# Patient Record
Sex: Male | Born: 1960 | Race: Black or African American | Hispanic: No | Marital: Single | Smoking: Current every day smoker
Health system: Southern US, Community
[De-identification: ages and names within clinical notes are randomized; demographics above are authoritative.]

## PROBLEM LIST (undated history)

## (undated) DIAGNOSIS — I10 Essential (primary) hypertension: Secondary | ICD-10-CM

## (undated) DIAGNOSIS — B1920 Unspecified viral hepatitis C without hepatic coma: Secondary | ICD-10-CM

---

## 2009-01-29 ENCOUNTER — Ambulatory Visit: Payer: Self-pay | Admitting: Gastroenterology

## 2009-03-02 ENCOUNTER — Ambulatory Visit: Payer: Self-pay | Admitting: Gastroenterology

## 2009-04-09 ENCOUNTER — Ambulatory Visit: Payer: Self-pay | Admitting: Gastroenterology

## 2009-10-08 ENCOUNTER — Ambulatory Visit: Payer: Self-pay | Admitting: Gastroenterology

## 2011-08-03 ENCOUNTER — Encounter (HOSPITAL_COMMUNITY): Payer: Self-pay

## 2011-08-03 ENCOUNTER — Emergency Department (HOSPITAL_COMMUNITY)
Admission: EM | Admit: 2011-08-03 | Discharge: 2011-08-03 | Disposition: A | Discharge status: Home / Self Care | Payer: Medicaid Other | Attending: Emergency Medicine | Admitting: Emergency Medicine

## 2011-08-03 ENCOUNTER — Encounter (HOSPITAL_COMMUNITY): Payer: Self-pay | Admitting: *Deleted

## 2011-08-03 ENCOUNTER — Emergency Department (HOSPITAL_COMMUNITY)
Admission: EM | Admit: 2011-08-03 | Discharge: 2011-08-03 | Disposition: A | Discharge status: Home / Self Care | Payer: Self-pay | Attending: Emergency Medicine | Admitting: Emergency Medicine

## 2011-08-03 DIAGNOSIS — I10 Essential (primary) hypertension: Secondary | ICD-10-CM | POA: Insufficient documentation

## 2011-08-03 DIAGNOSIS — R040 Epistaxis: Secondary | ICD-10-CM

## 2011-08-03 DIAGNOSIS — Z79899 Other long term (current) drug therapy: Secondary | ICD-10-CM | POA: Insufficient documentation

## 2011-08-03 DIAGNOSIS — B182 Chronic viral hepatitis C: Secondary | ICD-10-CM | POA: Insufficient documentation

## 2011-08-03 DIAGNOSIS — M810 Age-related osteoporosis without current pathological fracture: Secondary | ICD-10-CM | POA: Insufficient documentation

## 2011-08-03 DIAGNOSIS — Z8619 Personal history of other infectious and parasitic diseases: Secondary | ICD-10-CM | POA: Insufficient documentation

## 2011-08-03 HISTORY — DX: Essential (primary) hypertension: I10

## 2011-08-03 HISTORY — DX: Unspecified viral hepatitis C without hepatic coma: B19.20

## 2011-08-03 MED ORDER — OXYMETAZOLINE HCL 0.05 % NA SOLN
1.0000 | Freq: Once | NASAL | Status: AC
  Administered 2011-08-03: 1 via NASAL
  Filled 2011-08-03: qty 15

## 2011-08-03 MED ORDER — AYR SALINE NASAL NA GEL: NASAL | Status: DC

## 2011-08-03 NOTE — ED Provider Notes (Signed)
History     CSN: [id number]  Arrival date & time 08/03/11  0920   First MD Initiated Contact with Patient 08/03/11 0935      Chief Complaint  Patient presents with  . Epistaxis    (Consider location/radiation/quality/duration/timing/severity/associated sxs/prior treatment) Patient is a 51 y.o. male presenting with nosebleeds. The history is provided by the patient.  Epistaxis  This is a recurrent problem. The current episode started more than 1 week ago. The problem occurs daily. The bleeding has been from the left nare.  Pt ws just discharged from the ED treated for nose bleed. States as he was walking to the bus stop, bleeding began again.   Past Medical History  Diagnosis Date  . Hepatitis C   . Hypertension   . Osteoporosis     History reviewed. No pertinent past surgical history.  History reviewed. No pertinent family history.  History  Substance Use Topics  . Smoking status: Current Everyday Smoker  . Smokeless tobacco: Not on file  . Alcohol Use: No      Review of Systems  Constitutional: Negative.   HENT: Positive for nosebleeds.   Eyes: Negative.   Respiratory: Negative.   Cardiovascular: Negative.   Gastrointestinal: Negative.   Genitourinary: Negative.   Musculoskeletal: Negative.   Skin: Negative.   Neurological: Negative.   Psychiatric/Behavioral: Negative.     Allergies  Review of patient's allergies indicates no known allergies.  Home Medications   Current Outpatient Rx  Name Route Sig Dispense Refill  . AMLODIPINE BESY-BENAZEPRIL HCL 2.5-10 MG PO CAPS Oral Take 1 capsule by mouth daily.    Jannetta Quint SALINE NASAL NA GEL  Apply to both nares every 2-4 hrs 14 g 0  . HYDROCHLOROTHIAZIDE 12.5 MG PO CAPS Oral Take 12.5 mg by mouth daily.    Marland Kitchen OLANZAPINE 15 MG PO TABS Oral Take 15 mg by mouth at bedtime.      BP 144/98  Pulse 68  Temp(Src) 97.8 F (36.6 C) (Oral)  Resp 18  SpO2 97%  Physical Exam  Nursing note and vitals  reviewed. Constitutional: He is oriented to person, place, and time. He appears well-developed and well-nourished.  HENT:  Head: Normocephalic.  Nose: Epistaxis is observed.  Mouth/Throat: Uvula is midline and oropharynx is clear and moist.       Epistaxis left nare, mild, postnasal bleeding  Neck: Neck supple.  Cardiovascular: Normal rate, regular rhythm and normal heart sounds.   Pulmonary/Chest: Effort normal and breath sounds normal. No respiratory distress.  Neurological: He is alert and oriented to person, place, and time.  Skin: Skin is warm and dry.  Psychiatric: He has a normal mood and affect.    ED Course  Procedures (including critical care time)  Mild bleeding in left nare noted, pt is picking at it with a tissue. Afrin applied two sprays. Pressure held for 5 min. Bleeding resolved. There is one small spot to the septum with bleeding, which is cautarized using silver nitrate stick. Will watch for few minutes to make sure bleeding does not start again.   10:36 AM Pt monitored. Bleeding resolved. Pt did not want to wait for his papers to be printed. He walked out AMA.    No diagnosis found.    MDM          Lottie Mussel, PA 08/03/11 1036

## 2011-08-03 NOTE — ED Notes (Signed)
Minor bleeding noted. Lemont Fillers, PA at bedside

## 2011-08-03 NOTE — ED Provider Notes (Signed)
Medical screening examination/treatment/procedure(s) were performed by non-physician practitioner and as supervising physician I was immediately available for consultation/collaboration.  Toy Baker, MD 08/03/11 2027

## 2011-08-03 NOTE — Discharge Instructions (Signed)
Your blood pressure here is good, and your bleeding has resolved. Avoid picking your nose or blowing it. Use saline nasal spray for congestion. Use saline gel for moisturizing to prevent new bleeding. If bleeding occurs again, apply firm pressure. You can spray afrin nasal spray to help to stop bleeding, however, do not use this spray more then two times a day and for more then 3 days. Follow up with your doctor for recheck.  Nosebleed Nosebleeds can be caused by many conditions including trauma, infections, polyps, foreign bodies, dry mucous membranes or climate, medications and air conditioning. Most nosebleeds occur in the front of the nose. It is because of this location that most nosebleeds can be controlled by pinching the nostrils gently and continuously. Do this for at least 10 to 20 minutes. The reason for this long continuous pressure is that you must hold it long enough for the blood to clot. If during that 10 to 20 minute time period, pressure is released, the process may have to be started again. The nosebleed may stop by itself, quit with pressure, need concentrated heating (cautery) or stop with pressure from packing. HOME CARE INSTRUCTIONS   If your nose was packed, try to maintain the pack inside until your caregiver removes it. If a gauze pack was used and it starts to fall out, gently replace or cut the end off. Do not cut if a balloon catheter was used to pack the nose. Otherwise, do not remove unless instructed.   Avoid blowing your nose for 12 hours after treatment. This could dislodge the pack or clot and start bleeding again.   If the bleeding starts again, sit up and bending forward, gently pinch the front half of your nose continuously for 20 minutes.   If bleeding was caused by dry mucous membranes, cover the inside of your nose every morning with a petroleum or antibiotic ointment. Use your little fingertip as an applicator. Do this as needed during dry weather. This will keep  the mucous membranes moist and allow them to heal.   Maintain humidity in your home by using less air conditioning or using a humidifier.   Do not use aspirin or medications which make bleeding more likely. Your caregiver can give you recommendations on this.   Resume normal activities as able but try to avoid straining, lifting or bending at the waist for several days.   If the nosebleeds become recurrent and the cause is unknown, your caregiver may suggest laboratory tests.  SEEK IMMEDIATE MEDICAL CARE IF:   Bleeding recurs and cannot be controlled.   There is unusual bleeding from or bruising on other parts of the body.   You have a fever.   Nosebleeds continue.   There is any worsening of the condition which originally brought you in.   You become lightheaded, feel faint, become sweaty or vomit blood.  MAKE SURE YOU:   Understand these instructions.   Will watch your condition.   Will get help right away if you are not doing well or get worse.  Document Released: 03/16/2005 Document Revised: 02/16/2011 Document Reviewed: 05/08/2009 [facility] Patient Information 2012 [place], [place].

## 2011-08-03 NOTE — ED Provider Notes (Signed)
Medical screening examination/treatment/procedure(s) were performed by non-physician practitioner and as supervising physician I was immediately available for consultation/collaboration.  Toy Baker, MD 08/03/11 2026

## 2011-08-03 NOTE — ED Provider Notes (Signed)
History     CSN: [id number]  Arrival date & time 08/03/11  4098   First MD Initiated Contact with Patient 08/03/11 [phone]      Chief Complaint  Patient presents with  . Epistaxis    (Consider location/radiation/quality/duration/timing/severity/associated sxs/prior treatment) Patient is a 51 y.o. male presenting with nosebleeds. The history is provided by the patient.  Epistaxis  This is a new problem. The current episode started more than 1 week ago. The problem occurs daily. The problem has been resolved. Associated with: blowing his nose. The bleeding has been from both nares. He has tried applying pressure for the symptoms. The treatment provided significant relief.  Pt states he has had nose bleed every day when in the shower. States he blows his nose in the shower and it starts bleeding. Always resolves on its own with pressure. Pt is not on blood thinners. Admits to nose picking "once in a while." No injury to the nose. No headache. No other bleeding anywhere in the body.  Past Medical History  Diagnosis Date  . Hepatitis C   . Hypertension   . Osteoporosis     No past surgical history on file.  No family history on file.  History  Substance Use Topics  . Smoking status: Not on file  . Smokeless tobacco: Not on file  . Alcohol Use:       Review of Systems  Constitutional: Negative for fever, chills and fatigue.  HENT: Positive for nosebleeds.   Eyes: Negative.   Respiratory: Negative.   Cardiovascular: Negative.   Gastrointestinal: Negative.   Genitourinary: Negative.   Musculoskeletal: Negative.   Skin: Negative.   Neurological: Negative.   Hematological: Negative.   Psychiatric/Behavioral: Negative.     Allergies  Review of patient's allergies indicates no known allergies.  Home Medications   Current Outpatient Rx  Name Route Sig Dispense Refill  . AMLODIPINE BESY-BENAZEPRIL HCL 2.5-10 MG PO CAPS Oral Take 1 capsule by mouth daily.    Marland Kitchen  HYDROCHLOROTHIAZIDE 25 MG PO TABS Oral Take 12.5 mg by mouth daily.    Marland Kitchen OLANZAPINE 15 MG PO TABS Oral Take 15 mg by mouth at bedtime.      BP 122/83  Pulse 69  Temp(Src) 97.5 F (36.4 C) (Oral)  Resp 18  SpO2 98%  Physical Exam  Nursing note and vitals reviewed. Constitutional: He is oriented to person, place, and time. He appears well-developed and well-nourished. No distress.  HENT:  Head: Normocephalic and atraumatic.  Right Ear: Tympanic membrane, external ear and ear canal normal.  Left Ear: Tympanic membrane and ear canal normal.  Mouth/Throat: Uvula is midline, oropharynx is clear and moist and mucous membranes are normal.       Dried up blood in left naire. Right naire is normal. No bruising or swelling, or signs of trauma to the nose. No tenderness with palpation  Eyes: Conjunctivae are normal.  Neck: Neck supple.  Cardiovascular: Normal rate, regular rhythm and normal heart sounds.   Pulmonary/Chest: Effort normal and breath sounds normal. He has no wheezes. He has no rales.  Abdominal: Soft. Bowel sounds are normal.  Neurological: He is alert and oriented to person, place, and time.  Skin: Skin is warm and dry.  Psychiatric: He has a normal mood and affect.    ED Course  Procedures (including critical care time)  Pt with no current nose bleed. Instructed pt to stop picking his nose, and stop blowing his nose. Nasal saline and vasiline for  dryness. Afrin if starts bleeding again and pressure. BP here normal. No signs of trauma. No headache. No other complaints. Pt stable for discharge.   No diagnosis found.    MDM          Lottie Mussel, PA 08/03/11 [phone]

## 2011-08-03 NOTE — ED Notes (Signed)
From [facility] - pt reports epistaxis from both nares. Bleeding controlled upon EMS arrival

## 2011-08-03 NOTE — ED Notes (Signed)
Patient presents with epistaxis to left nare that began this AM.  Patient recently discharged from [facility] for same and reports when he walked to the bus stop the bleeding began again. Patient was discharged with Afrin nasal spray.  No active bleeding noted at this time.

## 2012-05-12 ENCOUNTER — Emergency Department (HOSPITAL_COMMUNITY)
Admission: EM | Admit: 2012-05-12 | Discharge: 2012-05-12 | Disposition: A | Discharge status: Home / Self Care | Payer: Medicaid Other

## 2012-05-12 NOTE — ED Notes (Signed)
Per EMS - [facility] lives at residential center, reported to have "stabbed" himself in his scrotum with an ink pen to get rid of "2 bumps that were down there". BP - 144/90, HR - 64, Resp- 16. Pt is ambulatory in ED.

## 2012-05-12 NOTE — ED Notes (Signed)
Pt came out to desk and indicated he did not need to be seen because "the bleeding has stopped". Tried to encourage pt to stay but pt continued and walked out of the ED to the bus stop. Charge nurse notified of same.

## 2012-12-01 ENCOUNTER — Emergency Department (HOSPITAL_COMMUNITY)
Admission: EM | Admit: 2012-12-01 | Discharge: 2012-12-01 | Disposition: A | Discharge status: Home / Self Care | Payer: Medicaid Other | Attending: Emergency Medicine | Admitting: Emergency Medicine

## 2012-12-01 ENCOUNTER — Encounter (HOSPITAL_COMMUNITY): Payer: Self-pay | Admitting: *Deleted

## 2012-12-01 ENCOUNTER — Emergency Department (HOSPITAL_COMMUNITY): Payer: Medicaid Other

## 2012-12-01 DIAGNOSIS — Z8739 Personal history of other diseases of the musculoskeletal system and connective tissue: Secondary | ICD-10-CM | POA: Insufficient documentation

## 2012-12-01 DIAGNOSIS — F17200 Nicotine dependence, unspecified, uncomplicated: Secondary | ICD-10-CM | POA: Insufficient documentation

## 2012-12-01 DIAGNOSIS — Z8619 Personal history of other infectious and parasitic diseases: Secondary | ICD-10-CM | POA: Insufficient documentation

## 2012-12-01 DIAGNOSIS — R079 Chest pain, unspecified: Secondary | ICD-10-CM

## 2012-12-01 DIAGNOSIS — R1110 Vomiting, unspecified: Secondary | ICD-10-CM | POA: Insufficient documentation

## 2012-12-01 DIAGNOSIS — I10 Essential (primary) hypertension: Secondary | ICD-10-CM | POA: Insufficient documentation

## 2012-12-01 DIAGNOSIS — Z79899 Other long term (current) drug therapy: Secondary | ICD-10-CM | POA: Insufficient documentation

## 2012-12-01 DIAGNOSIS — R071 Chest pain on breathing: Secondary | ICD-10-CM | POA: Insufficient documentation

## 2012-12-01 LAB — CBC
HCT: 43.3 % (ref 39.0–52.0)
MCV: 83.4 fL (ref 78.0–100.0)
Platelets: 236 10*3/uL (ref 150–400)
RBC: 5.19 MIL/uL (ref 4.22–5.81)
RDW: 13.7 % (ref 11.5–15.5)
WBC: 6 10*3/uL (ref 4.0–10.5)

## 2012-12-01 LAB — BASIC METABOLIC PANEL
BUN: 17 mg/dL (ref 6–23)
CO2: 27 mEq/L (ref 19–32)
Chloride: 99 mEq/L (ref 96–112)
Creatinine, Ser: 1.14 mg/dL (ref 0.50–1.35)
GFR calc Af Amer: 84 mL/min — ABNORMAL LOW (ref >90)
Potassium: 3.5 mEq/L (ref 3.5–5.1)

## 2012-12-01 LAB — POCT I-STAT TROPONIN I
Troponin i, poc: 0.01 ng/mL (ref 0.00–0.08)
Troponin i, poc: 0 ng/mL (ref 0.00–0.08)

## 2012-12-01 MED ORDER — MORPHINE SULFATE 4 MG/ML IJ SOLN
4.0000 mg | Freq: Once | INTRAMUSCULAR | Status: AC
  Administered 2012-12-01: 4 mg via INTRAVENOUS
  Filled 2012-12-01: qty 1

## 2012-12-01 MED ORDER — PANTOPRAZOLE SODIUM 40 MG IV SOLR
40.0000 mg | Freq: Once | INTRAVENOUS | Status: AC
  Administered 2012-12-01: 40 mg via INTRAVENOUS
  Filled 2012-12-01: qty 40

## 2012-12-01 MED ORDER — ASPIRIN 81 MG PO CHEW
324.0000 mg | CHEWABLE_TABLET | Freq: Once | ORAL | Status: AC
  Administered 2012-12-01: 324 mg via ORAL
  Filled 2012-12-01: qty 4

## 2012-12-01 MED ORDER — ONDANSETRON HCL 4 MG/2ML IJ SOLN
4.0000 mg | Freq: Once | INTRAMUSCULAR | Status: AC
  Administered 2012-12-01: 4 mg via INTRAVENOUS
  Filled 2012-12-01: qty 2

## 2012-12-01 MED ORDER — HYDROCODONE-ACETAMINOPHEN 5-325 MG PO TABS
2.0000 | ORAL_TABLET | ORAL | Status: DC | PRN
Start: 2012-12-01 — End: 2014-02-08

## 2012-12-01 MED ORDER — NITROGLYCERIN 0.4 MG SL SUBL
0.4000 mg | SUBLINGUAL_TABLET | SUBLINGUAL | Status: DC | PRN
  Filled 2012-12-01: qty 75

## 2012-12-01 MED ORDER — GI COCKTAIL ~~LOC~~
30.0000 mL | Freq: Once | ORAL | Status: AC
  Administered 2012-12-01: 30 mL via ORAL
  Filled 2012-12-01: qty 30

## 2012-12-01 MED ORDER — IBUPROFEN 600 MG PO TABS: 600.0000 mg | ORAL_TABLET | Freq: Four times a day (QID) | ORAL | Status: DC | PRN

## 2012-12-01 MED ORDER — HYDROMORPHONE HCL PF 1 MG/ML IJ SOLN
1.0000 mg | Freq: Once | INTRAMUSCULAR | Status: AC
  Administered 2012-12-01: 1 mg via INTRAVENOUS
  Filled 2012-12-01: qty 1

## 2012-12-01 NOTE — ED Notes (Signed)
The patient cursed at me when I asked him for his height and weight.  I advised this is needed for proper medication dosages.

## 2012-12-01 NOTE — ED Notes (Signed)
The patient demanded more pain medicine.  I advised him that I would let the doctor know about his request as soon as he was done intubating another patient who is in critical condition.

## 2012-12-01 NOTE — ED Notes (Signed)
~  215: sudden lt. Side cp, non radiating, "tightness/pressure." x 1 n/v upon ems arrival.  18 lt a/c. 324 mg asa. X 2 sl ntg. 0.4 mg with relief 10/10. 12 lead unremarkable.

## 2012-12-01 NOTE — ED Notes (Addendum)
MD was advised the patient wants more pain medicine.

## 2012-12-01 NOTE — ED Provider Notes (Signed)
History     CSN: [id number]  Arrival date & time 12/01/12  1478   None     Chief Complaint  Patient presents with  . Chest Pain    (Consider location/radiation/quality/duration/timing/severity/associated sxs/prior treatment) HPI Hx per PT  - went to sleep in his normal state of health, woke around 1am with sharp stabbing L sided CP, severe, not radiating no h/o same, no SOB, has associated N/V. No blood in emesis. Pain 10/10. No diaphoresis, cough, leg pain or swelling. BIB EMS, ASA and NTG in route,  Past Medical History  Diagnosis Date  . Hepatitis C   . Hypertension   . Osteoporosis     History reviewed. No pertinent past surgical history.  No family history on file.  History  Substance Use Topics  . Smoking status: Current Every Day Smoker  . Smokeless tobacco: Not on file  . Alcohol Use: No      Review of Systems  Constitutional: Negative for fever and chills.  HENT: Negative for neck pain and neck stiffness.   Eyes: Negative for pain.  Respiratory: Negative for shortness of breath.   Cardiovascular: Positive for chest pain.  Gastrointestinal: Positive for vomiting. Negative for abdominal pain.  Genitourinary: Negative for dysuria.  Musculoskeletal: Negative for back pain.  Skin: Negative for rash.  Neurological: Negative for headaches.  All other systems reviewed and are negative.    Allergies  Review of patient's allergies indicates no known allergies.  Home Medications   Current Outpatient Rx  Name  Route  Sig  Dispense  Refill  . amlodipine-benazepril (LOTREL) 2.5-10 MG per capsule   Oral   Take 1 capsule by mouth daily.         Marland Kitchen [place] Saline Nasal GEL      Apply to both nares every 2-4 hrs   14 g   0   . hydrochlorothiazide (MICROZIDE) 12.5 MG capsule   Oral   Take 12.5 mg by mouth daily.         Marland Kitchen OLANZapine (ZYPREXA) 15 MG tablet   Oral   Take 15 mg by mouth at bedtime.           BP 155/82  Pulse 70  Temp(Src) 98.2 F  (36.8 C) (Oral)  Resp 22  SpO2 100%  Physical Exam  Constitutional: He is oriented to person, place, and time. He appears well-developed and well-nourished.  HENT:  Head: Normocephalic and atraumatic.  Eyes: EOM are normal. Pupils are equal, round, and reactive to light.  Neck: Neck supple.  Cardiovascular: Normal rate, regular rhythm and intact distal pulses.   Pulmonary/Chest: Effort normal and breath sounds normal. No respiratory distress.  Reproducible left chest wall tenderness  Abdominal: Soft. Bowel sounds are normal. He exhibits no distension. There is no tenderness. There is no guarding.  Musculoskeletal: Normal range of motion. He exhibits no edema.  Neurological: He is alert and oriented to person, place, and time.  Skin: Skin is warm and dry.    ED Course  Procedures (including critical care time)  Results for orders placed during the hospital encounter of 12/01/12  CBC      Result Value Range   WBC 6.0  4.0 - 10.5 K/uL   RBC 5.19  4.22 - 5.81 MIL/uL   Hemoglobin 15.9  13.0 - 17.0 g/dL   HCT 29.5  62.1 - 30.8 %   MCV 83.4  78.0 - 100.0 fL   MCH 30.6  26.0 - 34.0 pg  MCHC 36.7 (*) 30.0 - 36.0 g/dL   RDW 16.1  09.6 - 04.5 %   Platelets 236  150 - 400 K/uL  BASIC METABOLIC PANEL      Result Value Range   Sodium 138  135 - 145 mEq/L   Potassium 3.5  3.5 - 5.1 mEq/L   Chloride 99  96 - 112 mEq/L   CO2 27  19 - 32 mEq/L   Glucose, Bld 152 (*) 70 - 99 mg/dL   BUN 17  6 - 23 mg/dL   Creatinine, Ser 4.09  0.50 - 1.35 mg/dL   Calcium 9.7  8.4 - 81.1 mg/dL   GFR calc non Af Amer 73 (*) >90 mL/min   GFR calc Af Amer 84 (*) >90 mL/min  POCT I-STAT TROPONIN I      Result Value Range   Troponin i, poc 0.01  0.00 - 0.08 ng/mL   Comment 3           POCT I-STAT TROPONIN I      Result Value Range   Troponin i, poc 0.00  0.00 - 0.08 ng/mL   Comment 3            Dg Chest Port 1 View  12/01/2012   *RADIOLOGY REPORT*  Clinical Data: Chest pain, shortness of breath   PORTABLE CHEST - 1 VIEW  Comparison: None.  Findings: Low lung volumes.  Patchy atelectasis or early infiltrates in both lung bases.  No effusion.  Heart size normal. Regional bones unremarkable.  IMPRESSION:  Mild bibasilar atelectasis or infiltrates.   Original Report Authenticated By: D. Andria Rhein, MD     Date: 12/01/2012  Rate: 69  Rhythm: normal sinus rhythm  QRS Axis: normal  Intervals: normal  ST/T Wave abnormalities: nonspecific ST changes  Conduction Disutrbances:none  Narrative Interpretation:   Old EKG Reviewed: none available  IV morphine, zofran, dilaudid GI cocktail and IV protonix  Recheck - pain resolved and PT requesting to be discharged. Plan f/u outpatient stress testing, CP precautions verbalized as understood  MDM   CP a typical for ACS, reproducible chest wall tenderness, eval;uated with ECg and serial troponins, CXR all reviewed as above  Improved with medications  VS and nurses notes reviewed and considered        Sunnie Nielsen, MD 12/03/12 [phone]

## 2013-01-17 ENCOUNTER — Ambulatory Visit: Payer: Medicaid Other | Admitting: Cardiovascular Disease

## 2014-01-21 ENCOUNTER — Other Ambulatory Visit: Payer: Medicaid Other

## 2014-01-21 DIAGNOSIS — B182 Chronic viral hepatitis C: Secondary | ICD-10-CM

## 2014-01-21 LAB — PROTIME-INR
INR: 1.04 (ref <1.50)
PROTHROMBIN TIME: 13.6 s (ref 11.6–15.2)

## 2014-01-21 LAB — CBC WITH DIFFERENTIAL/PLATELET
Basophils Absolute: 0 10*3/uL (ref 0.0–0.1)
Basophils Relative: 0 % (ref 0–1)
EOS ABS: 0.1 10*3/uL (ref 0.0–0.7)
Eosinophils Relative: 2 % (ref 0–5)
HEMATOCRIT: 38.9 % — AB (ref 39.0–52.0)
HEMOGLOBIN: 13.8 g/dL (ref 13.0–17.0)
Lymphocytes Relative: 30 % (ref 12–46)
Lymphs Abs: 2.1 10*3/uL (ref 0.7–4.0)
MCH: 29.5 pg (ref 26.0–34.0)
MCHC: 35.5 g/dL (ref 30.0–36.0)
MCV: 83.1 fL (ref 78.0–100.0)
MONO ABS: 0.5 10*3/uL (ref 0.1–1.0)
MONOS PCT: 7 % (ref 3–12)
Neutro Abs: 4.3 10*3/uL (ref 1.7–7.7)
Neutrophils Relative %: 61 % (ref 43–77)
Platelets: 231 10*3/uL (ref 150–400)
RBC: 4.68 MIL/uL (ref 4.22–5.81)
RDW: 14.5 % (ref 11.5–15.5)
WBC: 7 10*3/uL (ref 4.0–10.5)

## 2014-01-21 LAB — COMPREHENSIVE METABOLIC PANEL
ALBUMIN: 4.1 g/dL (ref 3.5–5.2)
ALT: 43 U/L (ref 0–53)
AST: 48 U/L — ABNORMAL HIGH (ref 0–37)
Alkaline Phosphatase: 76 U/L (ref 39–117)
BUN: 10 mg/dL (ref 6–23)
CO2: 27 mEq/L (ref 19–32)
Calcium: 9.2 mg/dL (ref 8.4–10.5)
Chloride: 102 mEq/L (ref 96–112)
Creat: 1.1 mg/dL (ref 0.50–1.35)
GLUCOSE: 94 mg/dL (ref 70–99)
Potassium: 3.3 mEq/L — ABNORMAL LOW (ref 3.5–5.3)
SODIUM: 137 meq/L (ref 135–145)
TOTAL PROTEIN: 6.9 g/dL (ref 6.0–8.3)
Total Bilirubin: 0.8 mg/dL (ref 0.2–1.2)

## 2014-01-21 LAB — IRON: Iron: 90 ug/dL (ref 42–165)

## 2014-01-22 LAB — HEPATITIS B SURFACE ANTIGEN: Hepatitis B Surface Ag: NEGATIVE

## 2014-01-22 LAB — HIV ANTIBODY (ROUTINE TESTING W REFLEX): HIV: NONREACTIVE

## 2014-01-22 LAB — HEPATITIS B SURFACE ANTIBODY,QUALITATIVE: HEP B S AB: NEGATIVE

## 2014-01-22 LAB — HEPATITIS B CORE ANTIBODY, TOTAL: HEP B C TOTAL AB: NONREACTIVE

## 2014-01-22 LAB — ANA: Anti Nuclear Antibody(ANA): NEGATIVE

## 2014-01-22 LAB — HEPATITIS A ANTIBODY, TOTAL: Hep A Total Ab: REACTIVE — AB

## 2014-01-23 LAB — HEPATITIS C RNA QUANTITATIVE
HCV Quantitative Log: 6.49 {Log} — ABNORMAL HIGH (ref <1.18)
HCV Quantitative: 3091485 IU/mL — ABNORMAL HIGH (ref <15)

## 2014-01-27 LAB — HEPATITIS C GENOTYPE

## 2014-02-08 ENCOUNTER — Encounter (HOSPITAL_COMMUNITY): Payer: Self-pay | Admitting: Emergency Medicine

## 2014-02-08 ENCOUNTER — Emergency Department (HOSPITAL_COMMUNITY)
Admission: EM | Admit: 2014-02-08 | Discharge: 2014-02-08 | Disposition: A | Discharge status: Home / Self Care | Payer: Medicaid Other | Attending: Emergency Medicine | Admitting: Emergency Medicine

## 2014-02-08 DIAGNOSIS — R220 Localized swelling, mass and lump, head: Secondary | ICD-10-CM | POA: Insufficient documentation

## 2014-02-08 DIAGNOSIS — K0889 Other specified disorders of teeth and supporting structures: Secondary | ICD-10-CM

## 2014-02-08 DIAGNOSIS — R221 Localized swelling, mass and lump, neck: Secondary | ICD-10-CM | POA: Diagnosis present

## 2014-02-08 DIAGNOSIS — K089 Disorder of teeth and supporting structures, unspecified: Secondary | ICD-10-CM | POA: Diagnosis not present

## 2014-02-08 DIAGNOSIS — F17200 Nicotine dependence, unspecified, uncomplicated: Secondary | ICD-10-CM | POA: Insufficient documentation

## 2014-02-08 DIAGNOSIS — Z79899 Other long term (current) drug therapy: Secondary | ICD-10-CM | POA: Diagnosis not present

## 2014-02-08 DIAGNOSIS — Z8619 Personal history of other infectious and parasitic diseases: Secondary | ICD-10-CM | POA: Insufficient documentation

## 2014-02-08 DIAGNOSIS — I10 Essential (primary) hypertension: Secondary | ICD-10-CM | POA: Insufficient documentation

## 2014-02-08 MED ORDER — IBUPROFEN 800 MG PO TABS
800.0000 mg | ORAL_TABLET | Freq: Once | ORAL | Status: AC
  Administered 2014-02-08: 800 mg via ORAL
  Filled 2014-02-08: qty 1

## 2014-02-08 MED ORDER — IBUPROFEN 600 MG PO TABS: 600.0000 mg | ORAL_TABLET | Freq: Four times a day (QID) | ORAL | Status: DC | PRN

## 2014-02-08 MED ORDER — PENICILLIN V POTASSIUM 250 MG PO TABS
500.0000 mg | ORAL_TABLET | Freq: Once | ORAL | Status: AC
  Administered 2014-02-08: 500 mg via ORAL
  Filled 2014-02-08: qty 2

## 2014-02-08 MED ORDER — PENICILLIN V POTASSIUM 500 MG PO TABS: 500.0000 mg | ORAL_TABLET | Freq: Four times a day (QID) | ORAL | Status: AC

## 2014-02-08 NOTE — ED Provider Notes (Signed)
CSN: [id number]     Arrival date & time 02/08/14  1903 History   First MD Initiated Contact with Patient 02/08/14 2111     Chief Complaint  Patient presents with  . Facial Swelling      Patient is a 53 y.o. male presenting with tooth pain. The history is provided by the patient.  Dental Pain Location:  Upper Severity:  Moderate Onset quality:  Gradual Duration:  1 week Timing:  Constant Progression:  Worsening Chronicity:  New Relieved by:  Rest Exacerbated by: chewing. Associated symptoms: facial swelling and gum swelling   Associated symptoms: no drooling and no fever   Pt reports for past week he has had upper facial swelling and dental pain No facial injury He also reports nasal congestion He reports mild difficulty swallowing  Past Medical History  Diagnosis Date  . Hepatitis C   . Hypertension   . Osteoporosis    History reviewed. No pertinent past surgical history. No family history on file. History  Substance Use Topics  . Smoking status: Current Every Day Smoker  . Smokeless tobacco: Not on file  . Alcohol Use: No    Review of Systems  Constitutional: Negative for fever.  HENT: Positive for facial swelling. Negative for drooling.   Skin: Negative for rash.  Neurological: Negative for syncope.      Allergies  Tylenol  Home Medications   Prior to Admission medications   Medication Sig Start Date End Date Taking? Authorizing Provider  hydrochlorothiazide (HYDRODIURIL) 25 MG tablet Take 25 mg by mouth daily.   Yes Historical Provider, MD  ibuprofen (ADVIL,MOTRIN) 200 MG tablet Take 200 mg by mouth every 6 (six) hours as needed for moderate pain.   Yes Historical Provider, MD  OLANZapine (ZYPREXA) 15 MG tablet Take 15 mg by mouth at bedtime.   Yes Historical Provider, MD  ibuprofen (ADVIL,MOTRIN) 600 MG tablet Take 1 tablet (600 mg total) by mouth every 6 (six) hours as needed for moderate pain. 02/08/14   Joya Gaskinsonald W Eydan Chianese, MD  penicillin v potassium  (VEETID) 500 MG tablet Take 1 tablet (500 mg total) by mouth 4 (four) times daily. 02/08/14 02/15/14  Joya Gaskinsonald W Daneen Volcy, MD   BP 162/87  Pulse 85  Temp(Src) 99.1 F (37.3 C) (Oral)  Resp 14  Ht 6\' 2"  (1.88 m)  Wt 245 lb (111.131 kg)  BMI 31.44 kg/m2  SpO2 100% Physical Exam CONSTITUTIONAL: Well developed/well nourished HEAD: Normocephalic/atraumatic EYES: EOMI/PERRL ENMT: Mucous membranes moist Poor dentition and diffuse gingival tenderness and swelling to upper incisors.  No focal abscess is noted to drain.  He has swelling to upper lip.  There is no swelling to lower lip.  There is no tongue swelling.  Uvula midline without edema.  No stridor, normal phonation NECK: supple no meningeal signs CV: S1/S2 noted, no murmurs/rubs/gallops noted LUNGS: Lungs are clear to auscultation bilaterally, no apparent distress ABDOMEN: soft, nontender, no rebound or guarding NEURO: Pt is awake/alert, moves all extremitiesx4 EXTREMITIES: pulses normal, full ROM SKIN: warm, color normal, no rash PSYCH: no abnormalities of mood noted  ED Course  Procedures  Strong suspicion for facial swelling is due to dentition He has no signs of allergic rxn He is on lotrel, Given there is a chance this is related to ACE inhibitor I will have him stop this med and consult his PCP next week I spoke to his caregiver at assisted living yvonne at [phone], she confirms story Will have him start PCN/ibuprofen MDM  Final diagnoses:  Pain, dental  Facial swelling    Nursing notes including past medical history and social history reviewed and considered in documentation     Joya Gaskins, MD 02/08/14 2158

## 2014-02-08 NOTE — ED Notes (Signed)
[facility] since Thursday getting worse

## 2014-02-08 NOTE — Discharge Instructions (Signed)
Dental Pain °A tooth ache may be caused by cavities (tooth decay). Cavities expose the nerve of the tooth to air and hot or cold temperatures. It may come from an infection or abscess (also called a boil or furuncle) around your tooth. It is also often caused by dental caries (tooth decay). This causes the pain you are having. °DIAGNOSIS  °Your caregiver can diagnose this problem by exam. °TREATMENT  °· If caused by an infection, it may be treated with medications which kill germs (antibiotics) and pain medications as prescribed by your caregiver. Take medications as directed. °· Only take over-the-counter or prescription medicines for pain, discomfort, or fever as directed by your caregiver. °· Whether the tooth ache today is caused by infection or dental disease, you should see your dentist as soon as possible for further care. °SEEK MEDICAL CARE IF: °The exam and treatment you received today has been provided on an emergency basis only. This is not a substitute for complete medical or dental care. If your problem worsens or new problems (symptoms) appear, and you are unable to meet with your dentist, call or return to this location. °SEEK IMMEDIATE MEDICAL CARE IF:  °· You have a fever. °· You develop redness and swelling of your face, jaw, or neck. °· You are unable to open your mouth. °· You have severe pain uncontrolled by pain medicine. °MAKE SURE YOU:  °· Understand these instructions. °· Will watch your condition. °· Will get help right away if you are not doing well or get worse. °Document Released: 06/06/2005 Document Revised: 08/29/2011 Document Reviewed: 01/23/2008 °ExitCare® Patient Information ©2015 [employer]. This information is not intended to replace advice given to you by your health care provider. Make sure you discuss any questions you have with your health care provider. ° °Emergency Department Resource Guide °1) Find a Doctor and Pay Out of Pocket °Although you won't have to find out who  is covered by your insurance plan, it is a good idea to ask around and get recommendations. You will then need to call the office and see if the doctor you have chosen will accept you as a new patient and what types of options they offer for patients who are self-pay. Some doctors offer discounts or will set up payment plans for their patients who do not have insurance, but you will need to ask so you aren't surprised when you get to your appointment. ° °2) Contact Your Local Health Department °Not all health departments have doctors that can see patients for sick visits, but many do, so it is worth a call to see if yours does. If you don't know where your local health department is, you can check in your phone book. The CDC also has a tool to help you locate your state's health department, and many state websites also have listings of all of their local health departments. ° °3) Find a Walk-in Clinic °If your illness is not likely to be very severe or complicated, you may want to try a walk in clinic. These are popping up all over the country in pharmacies, drugstores, and shopping centers. They're usually staffed by nurse practitioners or physician assistants that have been trained to treat common illnesses and complaints. They're usually fairly quick and inexpensive. However, if you have serious medical issues or chronic medical problems, these are probably not your best option. ° °No Primary Care Doctor: °- Call [facility] at  832-8000 - they can help you locate a primary   care doctor that  accepts your insurance, provides certain services, etc. °- Physician Referral Service- 1-800-533-3463 ° °Chronic Pain Problems: °Organization         Address  Phone   Notes  °[facility]  (336) 297-2271 Patients need to be referred by their primary care doctor.  ° °Medication Assistance: °Organization         Address  Phone   Notes  °[place] [facility] [address]  [address] °[place], [place] [address] (336) 641-8030 --Must be a resident of [place] °-- Must have NO insurance coverage whatsoever (no Medicaid/ Medicare, etc.) °-- The pt. MUST have a primary care doctor that directs their care regularly and follows them in the community °  °[facility]  (866) 331-1348   °[employer]  (888) 892-1162   ° °Agencies that provide inexpensive medical care: °Organization         Address  Phone   Notes  °[facility]  (336) 832-8035   °[facility]    (336) 832-7272   °[facility] [address] °[place], [place] [address] (336) 832-4777   °[facility] [address], °[place] (336) 271-4999   °[facility]    (336) 373-0678   °[facility]    (336) 272-1050   °[facility] ° [address], [place] Phone:  (336) 832-4444, Fax:  (336) 832-4440 Hours of Operation:  9 am - 6 pm, M-F.  Also accepts Medicaid/Medicare and self-pay.  °[facility] ° [address], [place] Phone: (336) 832-3150, Fax: (336) 832-3151. Hours of Operation:  8:30 am - 5:30 pm, M-F.  Also accepts Medicaid and self-pay.  °[facility] [place] [address], [place] Phone: (336) 878-6027   °[facility] [address], [place], [place] (336)723-1848, Ext. 123 Mondays & Thursdays: 7-9 AM.  First 15 patients are seen on a first come, first serve basis. °  ° °Medicaid-accepting [place] Providers: ° °Organization         Address  Phone   Notes  °[facility] [address], [place] (336) 641-2100 Also accepts self-pay patients.  °[facility] [address], [place] ° (336) 856-9996   °[facility] [address], [place] (336) 288-8857   °[facility] [address] [place] [address], [place] (336) 299-7000   °Veita Bland [address], [place]  ° (336) 373-1557 Only accepts [place] Access Medicaid patients after they have their name applied to their card.  ° °Self-Pay (no insurance) in [place]: ° °Organization         Address  Phone   Notes  °Sickle Cell Patients, [facility] [address], [place] (336) 832-1970   °[facility] [address], [place] (336) 832-4400   °[facility] ° [address], [place] (336) 992-4800   °[facility]/Dr. Osei-Bonsu ° [address] [place] [address], [place] or [address], [place] (336) 841-8500 Phone number for both [place] and [place] locations is the same.  °[facility] [address], [place] (336) 299-0000   °[facility] [address] [place] [address], [place] or [address] (336) 852-7530 °(336) 878-2260   °  [facility] [address], [place] (336) 350-1642, phone; (336) 294-5005, fax Sees patients 1st and 3rd Saturday of every month.  Must not qualify for public or private insurance (i.e. Medicaid, Medicare, [place] Health Choice, Veterans' Benefits) • Household income should be no more than 200% of the poverty level •The clinic cannot treat you if you are pregnant or think you are pregnant • Sexually transmitted diseases are not treated at the clinic.  ° ° °Dental Care: °Organization         Address  Phone  Notes  °[facility] [address], [place] (336) 641-6152 Accepts children up to age 21 who are enrolled in Medicaid or [place] Health Choice; pregnant women with a Medicaid card; and children who have applied for Medicaid or [place] Health Choice, but were declined, whose parents can pay a reduced fee at time of service.  °[facility] [place]  [address], [place] (336) 641-7733 Accepts children up to age 21 who are  enrolled in Medicaid or [place] Health Choice; pregnant women with a Medicaid card; and children who have applied for Medicaid or [place] Health Choice, but were declined, whose parents can pay a reduced fee at time of service.  °[facility] ° [address], [place] (336) 641-4533 Patients are seen by appointment only. Walk-ins are not accepted. [facility] will see patients 18 years of age and older. °Monday - Tuesday (8am-5pm) °Most Wednesdays (8:30-5pm) °$30 per visit, cash only  °[facility] ° [address], [place] (336) 641-4533 Patients are seen by appointment only. Walk-ins are not accepted. [facility] will see patients 18 years of age and older. °One Wednesday Evening (Monthly: Volunteer Based).  $30 per visit, cash only  °[facility]  (919) 537-3737 for adults; Children under age 4, call Graduate Pediatric Dentistry at (919) [phone]. Children aged 4-14, please call (919) [phone] to request a pediatric application. ° Dental services are provided in all areas of dental care including fillings, crowns and bridges, complete and partial dentures, implants, gum treatment, root canals, and extractions. Preventive care is also provided. Treatment is provided to both adults and children. °Patients are selected via a lottery and there is often a waiting list. °  °[facility] [address], °[place] ° (336) 763-8833 [email or web address] °  °[facility] [address], [place], [place] (336)723-1848, Ext. 123 Second and Fourth Thursday of each month, opens at 6:30 AM; Clinic ends at 9 AM.  Patients are seen on a first-come first-served basis, and a limited number are seen during each clinic.  ° °[facility] ° [address], [place], [place] (336) 723-7904   Eligibility Requirements °You must have lived in [place], [place], or [place] counties for at least the last three months. °  You  cannot be eligible for state or federal sponsored healthcare insurance, including Veterans Administration, Medicaid, or Medicare. °  You generally cannot be eligible for healthcare insurance through your employer.  °  How to apply: °Eligibility screenings are held every Tuesday and Wednesday afternoon from 1:00 pm until 4:00 pm. You do not need an appointment for the interview!  °[facility] [address], [place], [place] 336-631-2330   °[facility]  336-342-8273   °[facility]  336-703-3100   °[facility]  336-570-6415   ° °Behavioral Health Resources in the Community: °Intensive Outpatient Programs °Organization         Address  Phone  Notes  °[facility] [address], [place], [place] 336-878-6098   °[facility] [address], [place], [place] 336-832-9800   °[facility] [address], [place], [place] ° 336-882-2125   °[facility] [address],  °[place], [place] 1-800-853-5163 or 336-641-4981   °Substance Abuse Resources °Organization         Address  Phone  Notes  °[facility]  336-882-2125   °[facility]  336-784-9470   °[facility]  336-285-9073   °[facility]  336-845-3988   °Residential & Outpatient Substance Abuse Program  1-800-659-3381   °Psychological Services °Organization         Address  Phone  Notes  °[facility]  336- 832-9600   °[facility]  336- 378-7881   °[facility] [address], [place] 1-800-853-5163 or 336-641-4981   ° °Mobile Crisis Teams °Organization         Address  Phone  Notes  °[facility], Mobile Crisis Care Unit  1-877-626-1772   °Assertive °[facility] ° [address] [place], [place] 336-834-9664   °Sharon DeEsch [address] °[place] 336-554-5454   ° °Self-Help/Support  Groups °Organization         Address  Phone             Notes  °[facility] - variety of support groups  336- 373-1402 Call for more information  °Narcotics Anonymous (NA), [facility] [address], °[place]  2 meetings at this location  ° °Residential Treatment Programs °Organization         Address  Phone  Notes  °[facility] [address],    °[place]  1-866-801-8205   °[facility] ° [address], [place], [place] 704-293-8524   °[facility] [address], [place] 336-845-3988 Admissions: 8am-3pm M-F  °[facility] [address]    °[place], [place] 336-841-1104   °[facility] [address], [place], [place] 336-379-7146   °[facility] [address]  °[place], [place] 336-285-9073   °[facility] - Intensive Outpatient [address], [place], [place] 336-852-3033   °[facility] [address]  °[place], [place] 1-877-615-2722 or 336-784-9470   °[facility] [address] [place], [place] 336-227-7417 Accepts Medicaid  °[facility] [address]  °[place] 1-800-659-3381 Substance Abuse/Addiction Treatment  ° °[place] Behavioral Health Resources °Organization         Address  Phone  Notes  °[facility]  (888) 581-9988   °Julie Brannon, PhD [address] [place], [place]   (336) 349-5553 or (336) 951-0000   °[facility]   [address] °[place], [place] (336) 349-4454   °[facility] [address], [place], [place] (336) 342-8316 Insurance/Medicaid/sponsorship through [facility]  °[facility] [address]                                    [place], [place] (336) 342-8316 Therapy/tele-psych/case  °[facility]   [address]  ° [place], [place] (336) 349-2233    °Dr. Arfeen  (336) 349-4544   °[facility] [place]  [place]  [employer] [facility] [place] [facility] 1) [address], [place] °2) [address], [place] °3)  [address], [place] (336) 349-3220 °(336) 342-7768 ° °(336) 342-8140   °[place] [facility] (336) 342-1394 or (336) 342-3537 (After Hours)    ° ° ° °

## 2014-02-20 ENCOUNTER — Emergency Department (HOSPITAL_COMMUNITY)
Admission: EM | Admit: 2014-02-20 | Discharge: 2014-02-20 | Disposition: A | Discharge status: Home / Self Care | Payer: Medicaid Other | Attending: Emergency Medicine | Admitting: Emergency Medicine

## 2014-02-20 ENCOUNTER — Encounter (HOSPITAL_COMMUNITY): Payer: Self-pay | Admitting: Emergency Medicine

## 2014-02-20 ENCOUNTER — Emergency Department (HOSPITAL_COMMUNITY): Payer: Medicaid Other

## 2014-02-20 DIAGNOSIS — Z79899 Other long term (current) drug therapy: Secondary | ICD-10-CM | POA: Insufficient documentation

## 2014-02-20 DIAGNOSIS — S8991XA Unspecified injury of right lower leg, initial encounter: Secondary | ICD-10-CM

## 2014-02-20 DIAGNOSIS — F17200 Nicotine dependence, unspecified, uncomplicated: Secondary | ICD-10-CM | POA: Diagnosis not present

## 2014-02-20 DIAGNOSIS — S99919A Unspecified injury of unspecified ankle, initial encounter: Secondary | ICD-10-CM | POA: Diagnosis not present

## 2014-02-20 DIAGNOSIS — S8990XA Unspecified injury of unspecified lower leg, initial encounter: Secondary | ICD-10-CM | POA: Insufficient documentation

## 2014-02-20 DIAGNOSIS — S335XXA Sprain of ligaments of lumbar spine, initial encounter: Secondary | ICD-10-CM | POA: Insufficient documentation

## 2014-02-20 DIAGNOSIS — Z8739 Personal history of other diseases of the musculoskeletal system and connective tissue: Secondary | ICD-10-CM | POA: Diagnosis not present

## 2014-02-20 DIAGNOSIS — Z96659 Presence of unspecified artificial knee joint: Secondary | ICD-10-CM | POA: Diagnosis not present

## 2014-02-20 DIAGNOSIS — Z8619 Personal history of other infectious and parasitic diseases: Secondary | ICD-10-CM | POA: Diagnosis not present

## 2014-02-20 DIAGNOSIS — W1789XA Other fall from one level to another, initial encounter: Secondary | ICD-10-CM | POA: Diagnosis not present

## 2014-02-20 DIAGNOSIS — I10 Essential (primary) hypertension: Secondary | ICD-10-CM | POA: Diagnosis not present

## 2014-02-20 DIAGNOSIS — Y9289 Other specified places as the place of occurrence of the external cause: Secondary | ICD-10-CM | POA: Diagnosis not present

## 2014-02-20 DIAGNOSIS — S99929A Unspecified injury of unspecified foot, initial encounter: Principal | ICD-10-CM

## 2014-02-20 DIAGNOSIS — Y9389 Activity, other specified: Secondary | ICD-10-CM | POA: Diagnosis not present

## 2014-02-20 DIAGNOSIS — W19XXXA Unspecified fall, initial encounter: Secondary | ICD-10-CM

## 2014-02-20 DIAGNOSIS — S39012A Strain of muscle, fascia and tendon of lower back, initial encounter: Secondary | ICD-10-CM

## 2014-02-20 MED ORDER — NAPROXEN 500 MG PO TABS: 500.0000 mg | ORAL_TABLET | Freq: Two times a day (BID) | ORAL | Status: DC

## 2014-02-20 MED ORDER — NAPROXEN 250 MG PO TABS
500.0000 mg | ORAL_TABLET | Freq: Once | ORAL | Status: AC
  Administered 2014-02-20: 500 mg via ORAL
  Filled 2014-02-20: qty 2

## 2014-02-20 NOTE — ED Provider Notes (Signed)
Medical screening examination/treatment/procedure(s) were performed by non-physician practitioner and as supervising physician I was immediately available for consultation/collaboration.   EKG Interpretation None        Taurus Willis, MD 02/20/14 2330 

## 2014-02-20 NOTE — ED Notes (Signed)
Pt states he fell into the ceiling and his knee went through but he was able to catch himself, pt did not impact the floor.

## 2014-02-20 NOTE — Discharge Instructions (Signed)
Take Naprosyn as needed for pain. Rest, ice and elevate your knee. Refer to attached documents for more information.

## 2014-02-20 NOTE — ED Provider Notes (Signed)
CSN: [id number]     Arrival date & time 02/20/14  1954 History  This chart was scribed for a non-physician practitioner, Alvina Chou, working with Ezequiel Essex, MD by [place] Peace, ED Scribe. The patient was seen in TR07C/TR07C. The patient's care was started at 8:47 PM.    Chief Complaint  Patient presents with  . Fall      HPI HPI Comments: Vanden Fawaz is a 53 y.o. male who presents to the Emergency Department complaining of fall onset earlier today that occurred while pt was cleaning out his attic and fell through the ceiling. Pt states that he was able to catch himself on the beams so he did not fall completely through but injured his right knee and lower back in the process. Pt reports history of knee replacement 1 year ago. He denies hitting head or LOC at point during incident.  Pt has history of osteoporosis. Pt is current everyday smoker.   Past Medical History  Diagnosis Date  . Hepatitis C   . Hypertension   . Osteoporosis    History reviewed. No pertinent past surgical history. History reviewed. No pertinent family history. History  Substance Use Topics  . Smoking status: Current Every Day Smoker  . Smokeless tobacco: Not on file  . Alcohol Use: No    Review of Systems  All other systems reviewed and are negative.     Allergies  Tylenol  Home Medications   Prior to Admission medications   Medication Sig Start Date End Date Taking? Authorizing Provider  hydrochlorothiazide (HYDRODIURIL) 25 MG tablet Take 25 mg by mouth daily.    Historical Provider, MD  ibuprofen (ADVIL,MOTRIN) 200 MG tablet Take 200 mg by mouth every 6 (six) hours as needed for moderate pain.    Historical Provider, MD  ibuprofen (ADVIL,MOTRIN) 600 MG tablet Take 1 tablet (600 mg total) by mouth every 6 (six) hours as needed for moderate pain. 02/08/14   Sharyon Cable, MD  OLANZapine (ZYPREXA) 15 MG tablet Take 15 mg by mouth at bedtime.    Historical Provider, MD   BP 138/76   Pulse 96  Temp(Src) 99.3 F (37.4 C)  Resp 20  SpO2 94% Physical Exam  Nursing note and vitals reviewed. Constitutional: He is oriented to person, place, and time. He appears well-developed and well-nourished. No distress.  HENT:  Head: Normocephalic and atraumatic.  Eyes: Conjunctivae and EOM are normal.  Neck: Neck supple. No tracheal deviation present.  Cardiovascular: Normal rate and intact distal pulses.   Pulmonary/Chest: Effort normal. No respiratory distress.  Abdominal: Soft.  Musculoskeletal: Normal range of motion. He exhibits edema and tenderness.  Lumbar spine tenderness to palpation, L2-L5. No paraspinal tenderness to palpation. Right knee generalized edema without tenderness to palpation. No obvious deformity. Pt reports pain with flexion and extension of knee.   Neurological: He is alert and oriented to person, place, and time.  Extremity strength and sensation equal and intact bilaterally.   Skin: Skin is warm and dry.  Psychiatric: He has a normal mood and affect. His behavior is normal.    ED Course  Procedures (including critical care time) Labs Review Labs Reviewed - No data to display  Results for orders placed in visit on 01/21/14  HEPATITIS A ANTIBODY, TOTAL      Result Value Ref Range   Hep A Total Ab REACTIVE (*) NON REACTIVE  HEPATITIS B CORE ANTIBODY, TOTAL      Result Value Ref Range   Hep B  Core Total Ab NON REACTIVE  NON REACTIVE  HEPATITIS B SURFACE ANTIBODY      Result Value Ref Range   Hep B S Ab NEG  NEGATIVE  HEPATITIS B SURFACE ANTIGEN      Result Value Ref Range   Hepatitis B Surface Ag NEGATIVE  NEGATIVE  HEPATITIS C GENOTYPE      Result Value Ref Range   HCV Genotype 1a    HIV ANTIBODY (ROUTINE TESTING)      Result Value Ref Range   HIV 1&2 Ab, 4th Generation NONREACTIVE  NONREACTIVE  ANA      Result Value Ref Range   ANA NEG  NEGATIVE  CBC WITH DIFFERENTIAL      Result Value Ref Range   WBC 7.0  4.0 - 10.5 K/uL   RBC 4.68   4.22 - 5.81 MIL/uL   Hemoglobin 13.8  13.0 - 17.0 g/dL   HCT 38.9 (*) 39.0 - 52.0 %   MCV 83.1  78.0 - 100.0 fL   MCH 29.5  26.0 - 34.0 pg   MCHC 35.5  30.0 - 36.0 g/dL   RDW 14.5  11.5 - 15.5 %   Platelets 231  150 - 400 K/uL   Neutrophils Relative % 61  43 - 77 %   Neutro Abs 4.3  1.7 - 7.7 K/uL   Lymphocytes Relative 30  12 - 46 %   Lymphs Abs 2.1  0.7 - 4.0 K/uL   Monocytes Relative 7  3 - 12 %   Monocytes Absolute 0.5  0.1 - 1.0 K/uL   Eosinophils Relative 2  0 - 5 %   Eosinophils Absolute 0.1  0.0 - 0.7 K/uL   Basophils Relative 0  0 - 1 %   Basophils Absolute 0.0  0.0 - 0.1 K/uL   Smear Review Criteria for review not met    COMPREHENSIVE METABOLIC PANEL      Result Value Ref Range   Sodium 137  135 - 145 mEq/L   Potassium 3.3 (*) 3.5 - 5.3 mEq/L   Chloride 102  96 - 112 mEq/L   CO2 27  19 - 32 mEq/L   Glucose, Bld 94  70 - 99 mg/dL   BUN 10  6 - 23 mg/dL   Creat 1.10  0.50 - 1.35 mg/dL   Total Bilirubin 0.8  0.2 - 1.2 mg/dL   Alkaline Phosphatase 76  39 - 117 U/L   AST 48 (*) 0 - 37 U/L   ALT 43  0 - 53 U/L   Total Protein 6.9  6.0 - 8.3 g/dL   Albumin 4.1  3.5 - 5.2 g/dL   Calcium 9.2  8.4 - 10.5 mg/dL  PROTIME-INR      Result Value Ref Range   Prothrombin Time 13.6  11.6 - 15.2 seconds   INR 1.04  <1.50  IRON      Result Value Ref Range   Iron 90  42 - 165 ug/dL  HEPATITIS C RNA QUANTITATIVE      Result Value Ref Range   HCV Quantitative [id number] (*) <15 IU/mL   HCV Quantitative Log 6.49 (*) <1.18 log 10   No results found.    Imaging Review Dg Lumbar Spine Complete  02/20/2014   CLINICAL DATA:  FALL  EXAM: LUMBAR SPINE - COMPLETE 4+ VIEW  COMPARISON:  None.  FINDINGS: Mild bilateral facet degenerative hypertrophy L4-5 and L5-S1. Normal alignment. Vertebral body and disc heights maintained. Negative for fracture. Normal  mineralization. Patchy aortic calcifications.  IMPRESSION: 1. Negative for fracture or other acute bony injury. 2. 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   Electronically Signed   By: Arne Cleveland M.D.   On: 02/20/2014 21:40   Dg Knee Complete 4 Views Right  02/20/2014   CLINICAL DATA:  FALL  EXAM: RIGHT KNEE - COMPLETE 4+ VIEW  COMPARISON:  MR 11/30/2012 by report only  FINDINGS: Mild narrowing of the articular cartilage in the medial compartment. Small marginal spurs about all 3 compartments of the knee. Small effusion in the suprapatellar bursa. Negative for fracture or dislocation. Normal mineralization and alignment.  IMPRESSION: 1. Tricompartmental degenerative spurring and small effusion. No acute abnormality.   Electronically Signed   By: Arne Cleveland M.D.   On: 02/20/2014 21:39     EKG Interpretation None     Medications - No data to display  8:50 PM- Treatment plan was discussed with patient who verbalizes understanding and agrees.   MDM   Final diagnoses:  Fall, initial encounter  Right knee injury, initial encounter  Lumbar strain, initial encounter   10:05 PM Patient's xrays unremarkable for acute changes. Patient will have naprosyn for pain. No bladder/bowel incontinence or saddle paresthesias. No other injury.   I personally performed the services described in this documentation, which was scribed in my presence. The recorded information has been reviewed and is accurate.    Alvina Chou, PA-C 02/20/14 2206

## 2014-02-20 NOTE — ED Notes (Signed)
Declined W/C at D/C and was escorted to lobby by RN. 

## 2014-02-20 NOTE — ED Notes (Signed)
Pt in via EMS to triage after a fall out of a ceiling earlier today, pt denies hitting head or LOC, ambulatory to triage, c/o pain to right knee and lower back, VSS during transport

## 2014-03-04 ENCOUNTER — Encounter: Payer: Self-pay | Admitting: Internal Medicine

## 2014-03-04 ENCOUNTER — Ambulatory Visit (INDEPENDENT_AMBULATORY_CARE_PROVIDER_SITE_OTHER): Payer: Medicaid Other | Admitting: Internal Medicine

## 2014-03-04 VITALS — BP 130/84 | HR 74 | Temp 98.2°F | Ht 74.0 in | Wt 253.0 lb

## 2014-03-04 DIAGNOSIS — B182 Chronic viral hepatitis C: Secondary | ICD-10-CM

## 2014-03-04 DIAGNOSIS — Z23 Encounter for immunization: Secondary | ICD-10-CM

## 2014-03-04 NOTE — Progress Notes (Signed)
+Reginald Martin is a 53 y.o. male who presents for initial evaluation and management of a positive Hepatitis C antibody test.  Patient tested positive this year part of routine screening. 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 Patient denies history of blood transfusion, history of clotting factor transfusion, multiple sexual partners, renal dialysis, sexual contact with person with liver disease, tattoos. Patient has had other studies performed. Results: hepatitis C RNA by PCR, result: positive. Patient has not had prior treatment for Hepatitis C. Patient does not have a past history of liver disease. Patient does not have a family history of liver disease.   HPI: He recently found out he is positive and has active chronic hepatitis C. No liver disease in his family.  Does not drink alcohol.  Last drug use was 30 years ago.    Patient does have documented immunity to Hepatitis A. Patient does not have documented immunity to Hepatitis B.     Review of Systems A comprehensive review of systems was negative.   Past Medical History  Diagnosis Date  . Hepatitis C   . Hypertension   . Osteoporosis     Prior to Admission medications   Medication Sig Start Date End Date Taking? Authorizing Provider  hydrochlorothiazide (HYDRODIURIL) 25 MG tablet Take 25 mg by mouth daily.   Yes Historical Provider, MD  ibuprofen (ADVIL,MOTRIN) 200 MG tablet Take 200 mg by mouth every 6 (six) hours as needed for moderate pain.   Yes Historical Provider, MD  naproxen (NAPROSYN) 500 MG tablet Take 1 tablet (500 mg total) by mouth 2 (two) times daily with a meal. 02/20/14  Yes Kaitlyn Szekalski, PA-C  OLANZapine (ZYPREXA) 15 MG tablet Take 15 mg by mouth at bedtime.   Yes Historical Provider, MD    Allergies  Allergen Reactions  . Tylenol [Acetaminophen] Other (See Comments)    Due to liver function    History  Substance Use Topics  . Smoking status: Current Every Day Smoker  -- 0.25 packs/day    Types: Cigarettes  . Smokeless tobacco: Never Used     Comment: trying to cut back  . Alcohol Use: No    No family history on file.    Objective:   Filed Vitals:   03/04/14 1412  BP: 130/84  Pulse: 74  Temp: 98.2 F (36.8 C)   in no apparent distress and alert HEENT: anicteric Cor RRR and No murmurs clear Bowel sounds are normal, liver is not enlarged, spleen is not enlarged peripheral pulses normal, no pedal edema, no clubbing or cyanosis negative for - jaundice, spider hemangioma, telangiectasia, palmar erythema, ecchymosis and atrophy  Laboratory Genotype:  Lab Results  Component Value Date   HCVGENOTYPE 1a 01/21/2014   HCV viral load:  Lab Results  Component Value Date   HCVQUANT [id number]* 01/21/2014   Lab Results  Component Value Date   WBC 7.0 01/21/2014   HGB 13.8 01/21/2014   HCT 38.9* 01/21/2014   MCV 83.1 01/21/2014   PLT 231 01/21/2014    Lab Results  Component Value Date   CREATININE 1.10 01/21/2014   BUN 10 01/21/2014   NA 137 01/21/2014   K 3.3* 01/21/2014   CL 102 01/21/2014   CO2 27 01/21/2014    Lab Results  Component Value Date   ALT 43 01/21/2014   AST 48* 01/21/2014   ALKPHOS 76 01/21/2014   BILITOT 0.8 01/21/2014   INR 1.04 01/21/2014  Assessment: Hepatitis C genotype 1a  Plan: 1) Patient counseled extensively on limiting acetaminophen to no more than 2 grams daily, avoidance of alcohol. 2) Transmission discussed with patient including sexual transmission, sharing razors and toothbrush.   3) Will need referral to gastroenterology if concern for cirrhosis; will get elastography.  4) Will need referral for substance abuse counseling: No. 5) Will prescribe Harvoni for 12 weeks once work up complete if F2 or worse.  6) Hepatitis A vaccine No. 7) Hepatitis B vaccine Yes.   8) Pneumovax vaccine if concern for cirrhosis 9) will follow up after starting medication or in 1 year if denied

## 2014-03-07 ENCOUNTER — Telehealth: Payer: Self-pay | Admitting: *Deleted

## 2014-03-07 NOTE — Telephone Encounter (Signed)
Patient's case worker at [facility] notified of appointment for ultrasound at [facility] on 03/20/14 at 8:45 AM. Nothing to eat or drink after midnight.

## 2014-03-11 ENCOUNTER — Encounter: Payer: Self-pay | Admitting: Internal Medicine

## 2014-03-20 ENCOUNTER — Ambulatory Visit (HOSPITAL_COMMUNITY)
Admission: RE | Admit: 2014-03-20 | Discharge: 2014-03-20 | Disposition: A | Discharge status: Home / Self Care | Payer: Medicaid Other | Source: Ambulatory Visit | Attending: Internal Medicine | Admitting: Internal Medicine

## 2014-03-20 ENCOUNTER — Other Ambulatory Visit: Payer: Self-pay | Admitting: Internal Medicine

## 2014-03-20 DIAGNOSIS — B182 Chronic viral hepatitis C: Secondary | ICD-10-CM | POA: Diagnosis present

## 2014-03-20 MED ORDER — LEDIPASVIR-SOFOSBUVIR 90-400 MG PO TABS: 1.0000 | ORAL_TABLET | Freq: Every day | ORAL | Status: DC

## 2014-04-03 ENCOUNTER — Telehealth: Payer: Self-pay | Admitting: *Deleted

## 2014-04-03 ENCOUNTER — Ambulatory Visit (INDEPENDENT_AMBULATORY_CARE_PROVIDER_SITE_OTHER): Payer: Medicaid Other | Admitting: *Deleted

## 2014-04-03 DIAGNOSIS — B182 Chronic viral hepatitis C: Secondary | ICD-10-CM | POA: Diagnosis not present

## 2014-04-03 DIAGNOSIS — Z23 Encounter for immunization: Secondary | ICD-10-CM

## 2014-04-03 NOTE — Telephone Encounter (Signed)
Patient came in today for Hep B#2.  He wanted to know the results of his elastography, status of his Harvoni application, also when to follow up with Dr. Luciana Axeomer.  Gave HBV, read his results.  Please advise when he should follow up with a physician visit. Andree CossHowell, Jeremey Bascom M, RN

## 2014-04-03 NOTE — Telephone Encounter (Signed)
It usually takes a couple of months for the harvoni from when he got the elastography 10/1, we are waiting for the insurance to reply.  He can come in and we can talk about the elastography if he likes prior to hearing about the medication.

## 2014-04-04 NOTE — Telephone Encounter (Signed)
Spoke with patient. He will wait until we hear from his insurance company for a follow up appointment.

## 2014-04-24 ENCOUNTER — Other Ambulatory Visit: Payer: Self-pay | Admitting: Pharmacist Clinician (PhC)/ Clinical Pharmacy Specialist

## 2014-04-24 ENCOUNTER — Other Ambulatory Visit: Payer: Self-pay | Admitting: Internal Medicine

## 2014-04-24 MED ORDER — RIBAVIRIN 200 MG PO CAPS: 600.0000 mg | ORAL_CAPSULE | Freq: Two times a day (BID) | ORAL | Status: DC

## 2014-04-24 MED ORDER — OMBITAS-PARITAPRE-RITONA-DASAB 12.5-75-50 &250 MG PO TBPK: 1.0000 | ORAL_TABLET | Freq: Two times a day (BID) | ORAL | Status: DC

## 2014-04-25 NOTE — Progress Notes (Signed)
Phone call to telephone number listed for the pt.  [facility] is the pt's current address and where this number answered.  RN left message for the Adult [facility] to return call to address Dr. Ephriam Knucklesomer's attached message.

## 2014-04-28 ENCOUNTER — Other Ambulatory Visit: Payer: Self-pay | Admitting: Internal Medicine

## 2014-04-28 DIAGNOSIS — B1710 Acute hepatitis C without hepatic coma: Secondary | ICD-10-CM

## 2014-04-28 NOTE — Progress Notes (Signed)
Patient started Reginald Martin today and lab appointment scheduled for 05/12/14 [facility]

## 2014-05-12 ENCOUNTER — Ambulatory Visit: Payer: Medicaid Other | Admitting: Pharmacist Clinician (PhC)/ Clinical Pharmacy Specialist

## 2014-05-12 ENCOUNTER — Other Ambulatory Visit: Payer: Medicaid Other

## 2014-05-12 DIAGNOSIS — B182 Chronic viral hepatitis C: Secondary | ICD-10-CM

## 2014-05-12 NOTE — Progress Notes (Signed)
Patient ID: Reginald Martin, male   DOB: [birth year], 53 y.o.   MRN: [id number] HPI: Reginald Martin is a 53 y.o. male with Hep C who is here for his first follow up after starting therapy.   Allergies: Allergies  Allergen Reactions  . Tylenol [Acetaminophen] Other (See Comments)    Due to liver function    Vitals:    Past Medical History: Past Medical History  Diagnosis Date  . Hepatitis C   . Hypertension   . Osteoporosis     Social History: History   Social History  . Marital Status: Single    Spouse Name: N/A    Number of Children: N/A  . Years of Education: N/A   Social History Main Topics  . Smoking status: Current Every Day Smoker -- 0.25 packs/day    Types: Cigarettes  . Smokeless tobacco: Never Used     Comment: trying to cut back  . Alcohol Use: No  . Drug Use: No  . Sexual Activity: Not on file   Other Topics Concern  . Not on file   Social History Narrative  . No narrative on file    Previous Regimen: None   Current Regimen: Viekira Pak + ribavirin  Labs: HEP B S AB (no units)  Date Value  01/21/2014 NEG   HEPATITIS B SURFACE AG (no units)  Date Value  01/21/2014 NEGATIVE    CrCl: CrCl cannot be calculated (Unknown ideal weight.).  Lipids: No results found for: CHOL, TRIG, HDL, CHOLHDL, VLDL, LDLCALC  Assessment: 53 yo who started on viekira pak around 9/2 so he is going on his 3 wk now. He is here for a follow up for his Hep C. He is doing well. He stated that he has not missed any doses so far and is tolerating the meds extremely well. No issues with adverse effects. Told him to get his next this week so he doesn't run out due to the holiday schedule.   Recommendations: Cont Viekira Pak Cont Ribavirin 600mg  PO BID Viral load lab today for extension  Clide CliffPham, Tanessa Tidd Quang, PharmD Clinical Infectious Disease Pharmacist [facility] 05/12/2014, 9:29 AM

## 2014-05-13 LAB — HEPATITIS C RNA QUANTITATIVE: HCV QUANT: NOT DETECTED [IU]/mL (ref <15)

## 2014-05-28 ENCOUNTER — Ambulatory Visit (INDEPENDENT_AMBULATORY_CARE_PROVIDER_SITE_OTHER): Payer: Medicaid Other | Admitting: Internal Medicine

## 2014-05-28 ENCOUNTER — Encounter: Payer: Self-pay | Admitting: Internal Medicine

## 2014-05-28 VITALS — BP 142/82 | HR 76 | Temp 98.0°F | Wt 257.0 lb

## 2014-05-28 DIAGNOSIS — B182 Chronic viral hepatitis C: Secondary | ICD-10-CM | POA: Diagnosis not present

## 2014-05-28 LAB — CBC WITH DIFFERENTIAL/PLATELET
Basophils Absolute: 0 10*3/uL (ref 0.0–0.1)
Basophils Relative: 0 % (ref 0–1)
EOS PCT: 4 % (ref 0–5)
Eosinophils Absolute: 0.3 10*3/uL (ref 0.0–0.7)
HCT: 35.3 % — ABNORMAL LOW (ref 39.0–52.0)
HEMOGLOBIN: 11.7 g/dL — AB (ref 13.0–17.0)
LYMPHS ABS: 2.9 10*3/uL (ref 0.7–4.0)
LYMPHS PCT: 35 % (ref 12–46)
MCH: 29.4 pg (ref 26.0–34.0)
MCHC: 33.1 g/dL (ref 30.0–36.0)
MCV: 88.7 fL (ref 78.0–100.0)
MPV: 8.5 fL — AB (ref 9.4–12.4)
Monocytes Absolute: 0.6 10*3/uL (ref 0.1–1.0)
Monocytes Relative: 7 % (ref 3–12)
NEUTROS ABS: 4.4 10*3/uL (ref 1.7–7.7)
Neutrophils Relative %: 54 % (ref 43–77)
Platelets: 366 10*3/uL (ref 150–400)
RBC: 3.98 MIL/uL — AB (ref 4.22–5.81)
RDW: 15.7 % — ABNORMAL HIGH (ref 11.5–15.5)
WBC: 8.2 10*3/uL (ref 4.0–10.5)

## 2014-05-28 LAB — COMPLETE METABOLIC PANEL WITH GFR
ALBUMIN: 4.3 g/dL (ref 3.5–5.2)
ALT: 17 U/L (ref 0–53)
AST: 28 U/L (ref 0–37)
Alkaline Phosphatase: 101 U/L (ref 39–117)
BUN: 11 mg/dL (ref 6–23)
CALCIUM: 9.6 mg/dL (ref 8.4–10.5)
CHLORIDE: 102 meq/L (ref 96–112)
CO2: 29 meq/L (ref 19–32)
CREATININE: 1.13 mg/dL (ref 0.50–1.35)
GFR, EST AFRICAN AMERICAN: 85 mL/min
GFR, Est Non African American: 74 mL/min
Glucose, Bld: 96 mg/dL (ref 70–99)
POTASSIUM: 3.6 meq/L (ref 3.5–5.3)
Sodium: 139 mEq/L (ref 135–145)
TOTAL PROTEIN: 7.6 g/dL (ref 6.0–8.3)
Total Bilirubin: 1.2 mg/dL (ref 0.2–1.2)

## 2014-05-28 NOTE — Assessment & Plan Note (Signed)
Doing great.  Will continue for 12 weeks total.  CBC today to make sure no significant anemia.  EVR undetectable.  RTC 4 weeks unless concerns

## 2014-05-28 NOTE — Progress Notes (Signed)
   Subjective:    Patient ID: Reginald Martin, male    DOB: [birth year], 53 y.o.   MRN: [id number]  HPI Here for follow up of HCV.  Started ArvinMeritorVikiera Pak 11/2 and denies any missed doses.  Is genotype 1a, [id number].  Follow up vl at 3 weeks is negative.  Plan for 12 weeks.  Started hep B series.  Is F2/F3 on elastography.     Review of Systems  Constitutional: Negative for fatigue.  Gastrointestinal: Negative for nausea and diarrhea.  Skin: Negative for rash.  Neurological: Negative for dizziness and light-headedness.       Objective:   Physical Exam  Constitutional: He appears well-developed and well-nourished. No distress.  Eyes: No scleral icterus.  Cardiovascular: Normal rate, regular rhythm and normal heart sounds.   No murmur heard. Pulmonary/Chest: Effort normal and breath sounds normal. No respiratory distress.  Skin: No rash noted.          Assessment & Plan:

## 2014-06-25 ENCOUNTER — Encounter: Payer: Self-pay | Admitting: Internal Medicine

## 2014-06-25 ENCOUNTER — Ambulatory Visit (INDEPENDENT_AMBULATORY_CARE_PROVIDER_SITE_OTHER): Payer: Medicaid Other | Admitting: Internal Medicine

## 2014-06-25 VITALS — BP 134/82 | HR 85 | Temp 98.4°F | Wt 264.0 lb

## 2014-06-25 DIAGNOSIS — B182 Chronic viral hepatitis C: Secondary | ICD-10-CM

## 2014-06-25 LAB — CBC WITH DIFFERENTIAL/PLATELET
BASOS PCT: 0 % (ref 0–1)
Basophils Absolute: 0 10*3/uL (ref 0.0–0.1)
Eosinophils Absolute: 0.3 10*3/uL (ref 0.0–0.7)
Eosinophils Relative: 3 % (ref 0–5)
HCT: 37.1 % — ABNORMAL LOW (ref 39.0–52.0)
Hemoglobin: 11.6 g/dL — ABNORMAL LOW (ref 13.0–17.0)
LYMPHS ABS: 2.7 10*3/uL (ref 0.7–4.0)
Lymphocytes Relative: 28 % (ref 12–46)
MCH: 29 pg (ref 26.0–34.0)
MCHC: 31.3 g/dL (ref 30.0–36.0)
MCV: 92.8 fL (ref 78.0–100.0)
MONOS PCT: 6 % (ref 3–12)
MPV: 8.7 fL (ref 8.6–12.4)
Monocytes Absolute: 0.6 10*3/uL (ref 0.1–1.0)
NEUTROS PCT: 63 % (ref 43–77)
Neutro Abs: 6 10*3/uL (ref 1.7–7.7)
Platelets: 424 10*3/uL — ABNORMAL HIGH (ref 150–400)
RBC: 4 MIL/uL — AB (ref 4.22–5.81)
RDW: 16.1 % — ABNORMAL HIGH (ref 11.5–15.5)
WBC: 9.6 10*3/uL (ref 4.0–10.5)

## 2014-06-25 LAB — COMPLETE METABOLIC PANEL WITH GFR
ALT: 14 U/L (ref 0–53)
AST: 20 U/L (ref 0–37)
Albumin: 4.2 g/dL (ref 3.5–5.2)
Alkaline Phosphatase: 85 U/L (ref 39–117)
BUN: 15 mg/dL (ref 6–23)
CALCIUM: 9.2 mg/dL (ref 8.4–10.5)
CHLORIDE: 103 meq/L (ref 96–112)
CO2: 29 mEq/L (ref 19–32)
CREATININE: 1.02 mg/dL (ref 0.50–1.35)
GFR, Est Non African American: 84 mL/min
Glucose, Bld: 73 mg/dL (ref 70–99)
Potassium: 3.8 mEq/L (ref 3.5–5.3)
Sodium: 140 mEq/L (ref 135–145)
Total Bilirubin: 1 mg/dL (ref 0.2–1.2)
Total Protein: 7.1 g/dL (ref 6.0–8.3)

## 2014-06-25 NOTE — Assessment & Plan Note (Signed)
Cbc, cmp today and he can return in 4 weeks after treatment completion.  If Hgb drops significantly, will reduce ribavirin to 600 mg daily.

## 2014-06-25 NOTE — Progress Notes (Signed)
   Subjective:    Patient ID: Reginald Martin, male    DOB: Feb 13, 1961, 54 y.o.   MRN: [id number]  HPI Here for follow up of HCV.  Started ArvinMeritorVikiera Pak 11/2 and denies any missed doses.  Is genotype 1a, [id number].  Follow up vl at 3 weeks is negative.  Now in week 9.  Plan for 12 weeks.  Started hep B series.  Is F2/F3 on elastography.   No issues today.  No fatigue, no headache, no SOB.     Review of Systems  Constitutional: Negative for fatigue.  Gastrointestinal: Negative for nausea and diarrhea.  Skin: Negative for rash.  Neurological: Negative for dizziness and light-headedness.       Objective:   Physical Exam  Constitutional: He appears well-developed and well-nourished. No distress.  Eyes: No scleral icterus.  Cardiovascular: Normal rate, regular rhythm and normal heart sounds.   No murmur heard. Pulmonary/Chest: Effort normal and breath sounds normal. No respiratory distress.  Skin: No rash noted.          Assessment & Plan:

## 2014-07-31 ENCOUNTER — Ambulatory Visit (INDEPENDENT_AMBULATORY_CARE_PROVIDER_SITE_OTHER): Payer: Medicaid Other | Admitting: Internal Medicine

## 2014-07-31 ENCOUNTER — Encounter: Payer: Self-pay | Admitting: Internal Medicine

## 2014-07-31 VITALS — BP 158/85 | HR 84 | Temp 97.9°F | Ht 73.0 in | Wt 266.0 lb

## 2014-07-31 DIAGNOSIS — B182 Chronic viral hepatitis C: Secondary | ICD-10-CM

## 2014-07-31 LAB — CBC WITH DIFFERENTIAL/PLATELET
BASOS ABS: 0 10*3/uL (ref 0.0–0.1)
BASOS PCT: 0 % (ref 0–1)
EOS ABS: 0.2 10*3/uL (ref 0.0–0.7)
Eosinophils Relative: 3 % (ref 0–5)
HCT: 39.8 % (ref 39.0–52.0)
Hemoglobin: 12.8 g/dL — ABNORMAL LOW (ref 13.0–17.0)
Lymphocytes Relative: 31 % (ref 12–46)
Lymphs Abs: 2.3 10*3/uL (ref 0.7–4.0)
MCH: 30.3 pg (ref 26.0–34.0)
MCHC: 32.2 g/dL (ref 30.0–36.0)
MCV: 94.1 fL (ref 78.0–100.0)
MONOS PCT: 7 % (ref 3–12)
MPV: 8.8 fL (ref 8.6–12.4)
Monocytes Absolute: 0.5 10*3/uL (ref 0.1–1.0)
NEUTROS ABS: 4.3 10*3/uL (ref 1.7–7.7)
Neutrophils Relative %: 59 % (ref 43–77)
PLATELETS: 347 10*3/uL (ref 150–400)
RBC: 4.23 MIL/uL (ref 4.22–5.81)
RDW: 14.3 % (ref 11.5–15.5)
WBC: 7.3 10*3/uL (ref 4.0–10.5)

## 2014-07-31 NOTE — Progress Notes (Signed)
   Subjective:    Patient ID: Reginald Martin, male    DOB: [birth year], 54 y.o.   MRN: [id number]  HPI Here for follow up of HCV.  Started [name] 11/2 and denies any missed doses and now completed treatment.  Is genotype 1a, [id number].  Follow up vl at 3 weeks was negative.   Started hep B series.  Is F2/F3 on elastography.   No issues today.  Follow up Hgb was unchanged.     Review of Systems  Constitutional: Negative for fatigue.  Gastrointestinal: Negative for nausea and diarrhea.  Skin: Negative for rash.  Neurological: Negative for dizziness and light-headedness.       Objective:   Physical Exam  Constitutional: He appears well-developed and well-nourished. No distress.  Eyes: No scleral icterus.  Cardiovascular: Normal rate, regular rhythm and normal heart sounds.   No murmur heard. Pulmonary/Chest: Effort normal and breath sounds normal. No respiratory distress.  Skin: No rash noted.          Assessment & Plan:

## 2014-07-31 NOTE — Assessment & Plan Note (Signed)
Doing great, now finished treatment.  Will get end of treatment viral load today and again in 3 months to confirm cure.   RTC 3 months.  Counseled again in avoiding alcohol/beer.  Limiting Tylenol.

## 2014-08-02 LAB — HEPATITIS C RNA QUANTITATIVE: HCV QUANT: NOT DETECTED [IU]/mL (ref <15)

## 2014-09-02 ENCOUNTER — Ambulatory Visit (INDEPENDENT_AMBULATORY_CARE_PROVIDER_SITE_OTHER): Payer: Medicaid Other | Admitting: *Deleted

## 2014-09-02 DIAGNOSIS — Z23 Encounter for immunization: Secondary | ICD-10-CM | POA: Diagnosis not present

## 2014-09-02 NOTE — Progress Notes (Signed)
Hep B #3 vaccine

## 2014-11-04 IMAGING — CR DG KNEE COMPLETE 4+V*R*
4 series · 4 of 4 positions shown · non-contrast
Comparison: MR 11/30/2012 by report only

CLINICAL DATA: FALL

EXAM:
RIGHT KNEE - COMPLETE 4+ VIEW

[t knee ap right]
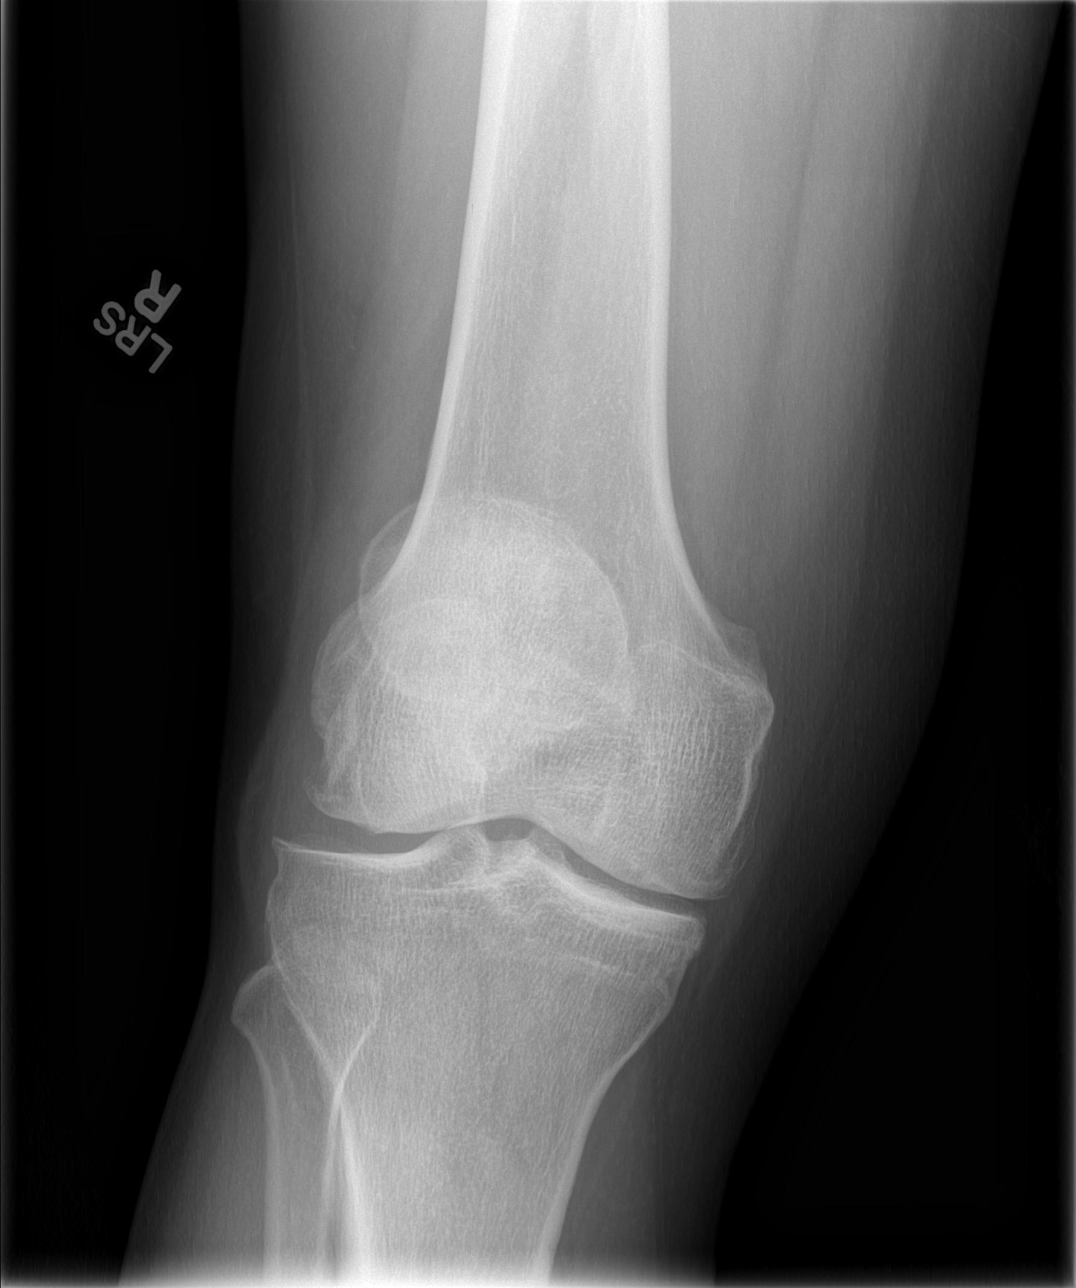

[t knee oblique right (1 of 2)]
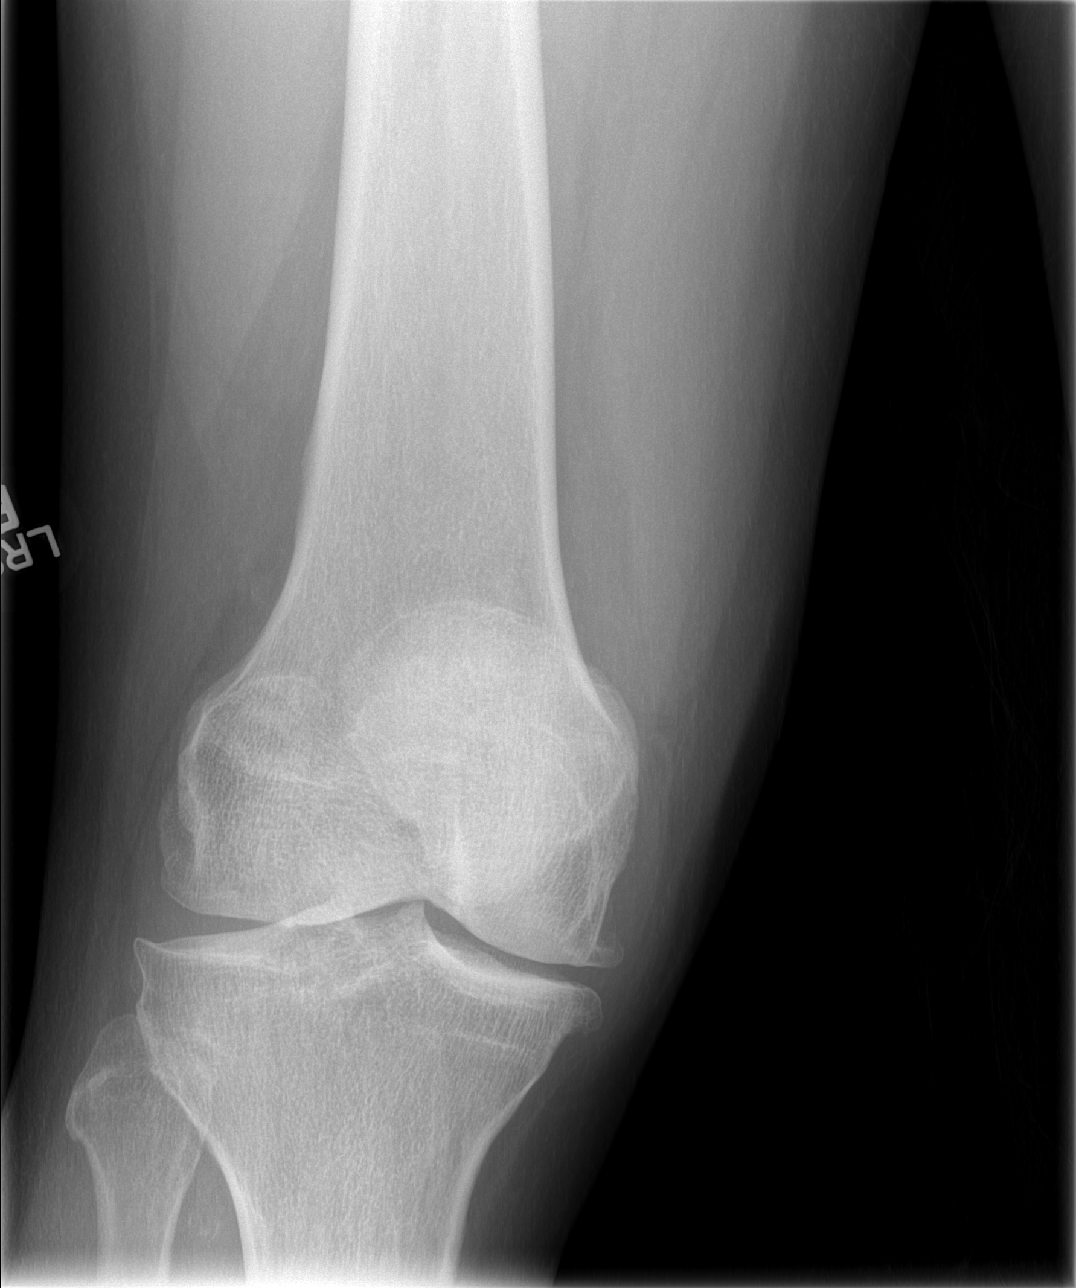

[t knee oblique right (2 of 2)]
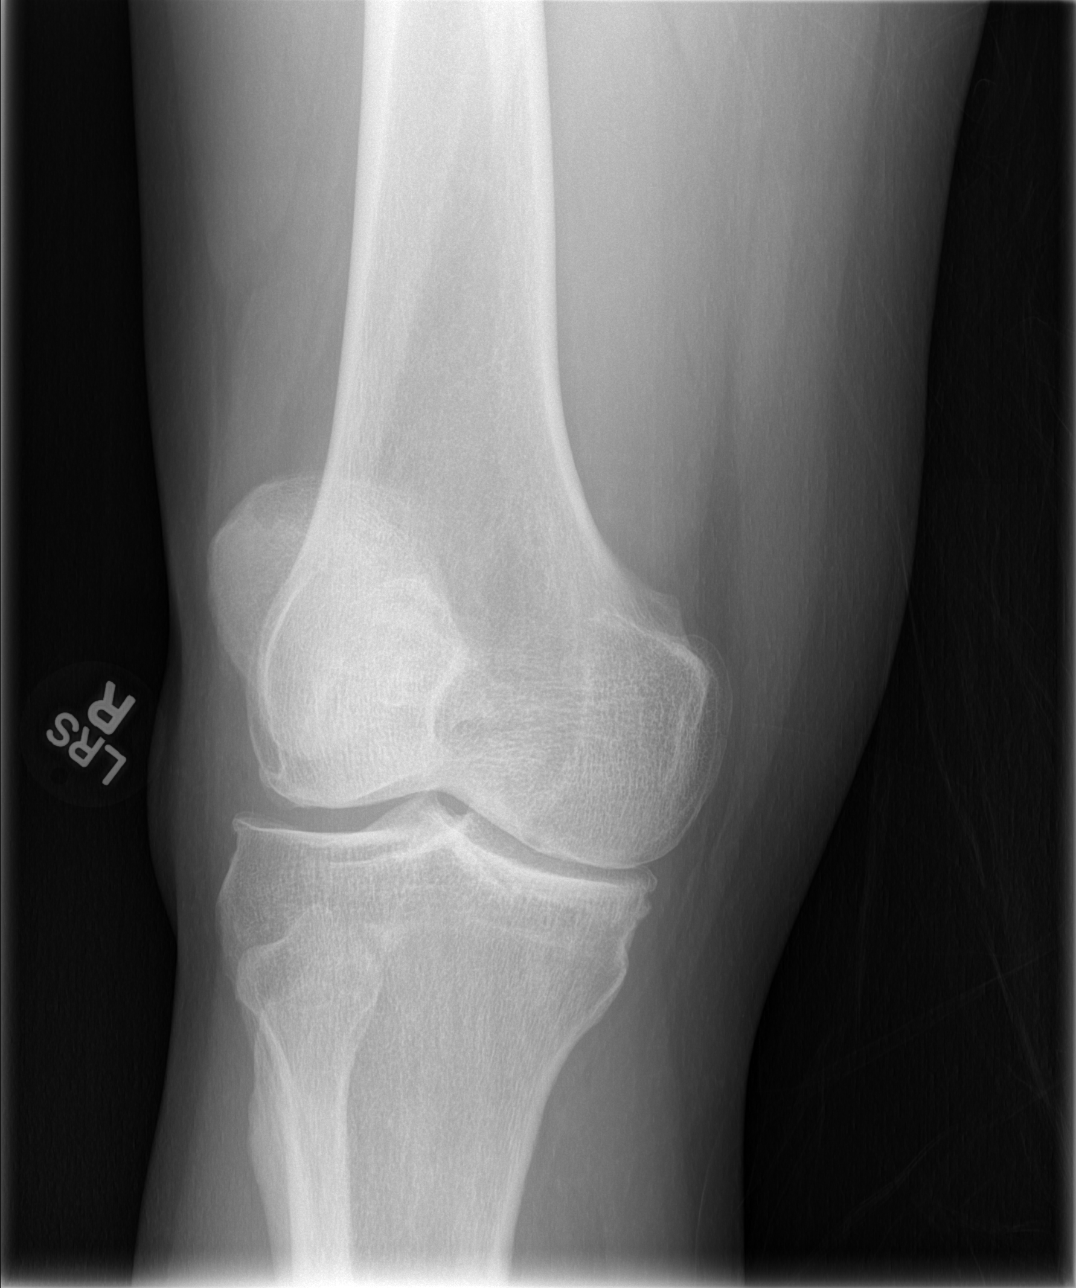

[t knee lat right]
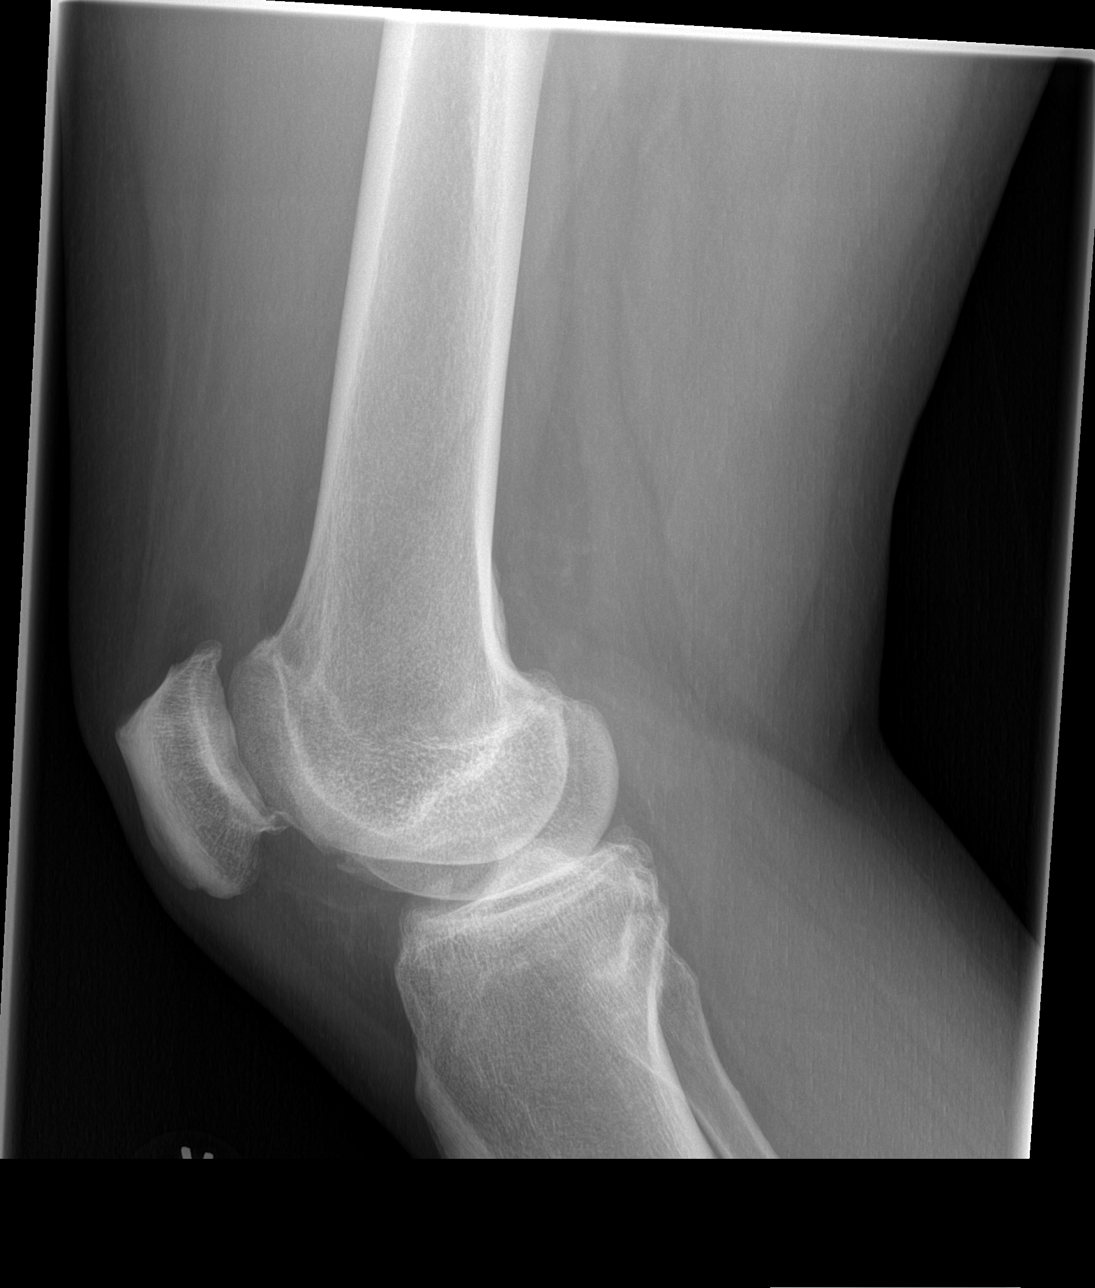

[4 of 4 positions shown; findings below may reference images not displayed]

FINDINGS: Mild narrowing of the articular cartilage in the medial compartment.
Small marginal spurs about all 3 compartments of the knee. Small
effusion in the suprapatellar bursa. Negative for fracture or
dislocation. Normal mineralization and alignment.
IMPRESSION: 1. Tricompartmental degenerative spurring and small effusion. No
acute abnormality.

## 2014-11-13 ENCOUNTER — Ambulatory Visit: Payer: Medicaid Other | Admitting: Internal Medicine

## 2014-12-02 IMAGING — US US ABDOMEN COMPLETE W/ ELASTOGRAPHY
1 series · 13 of 25 positions shown · non-contrast
Comparison: None.

CLINICAL DATA: Chronic hepatitis-C without hepatic coma.



[Series 1: us abdomen complete w/ elastography · 0.27mm/px · 13 of 65 slices shown]
[im 1/65]
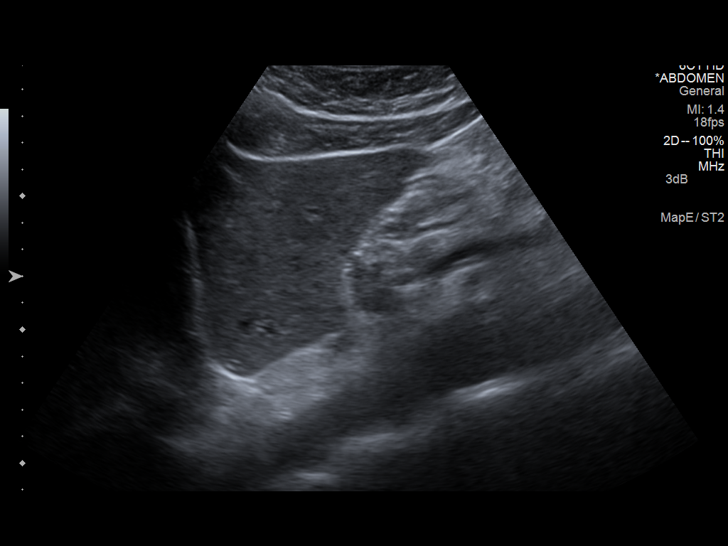
[im 6/65]
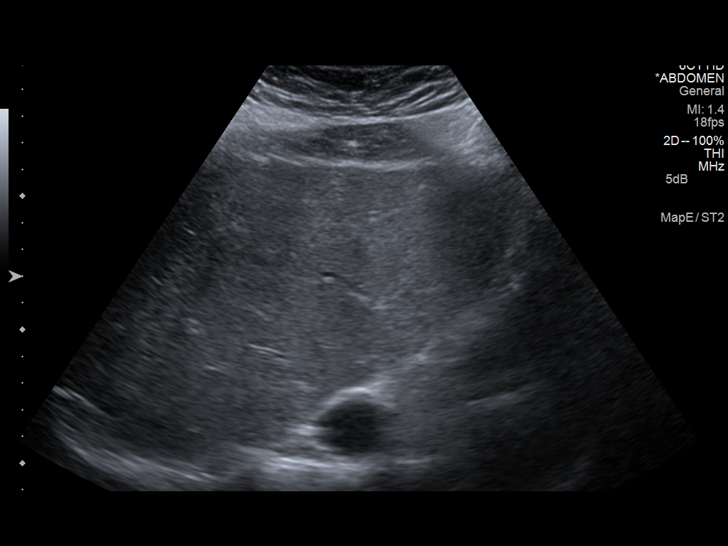
[im 11/65]
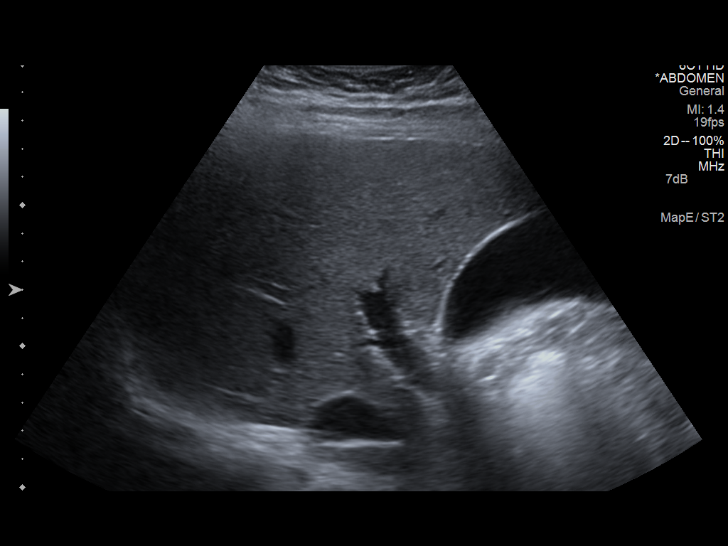
[im 17/65]
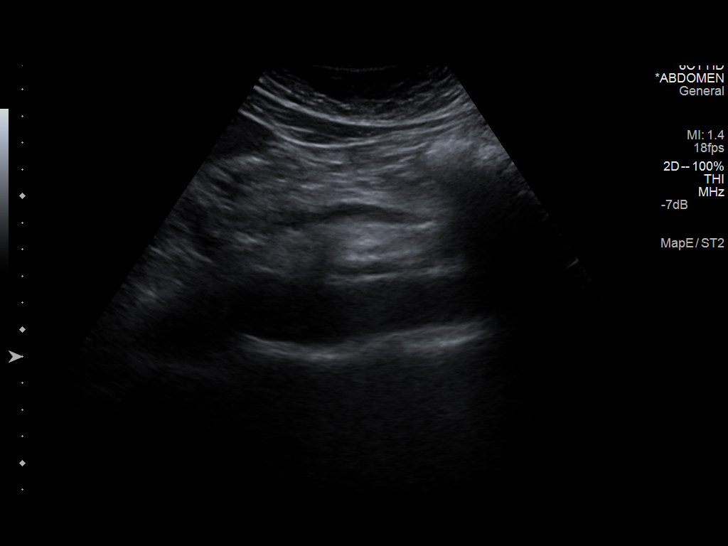
[im 22/65]
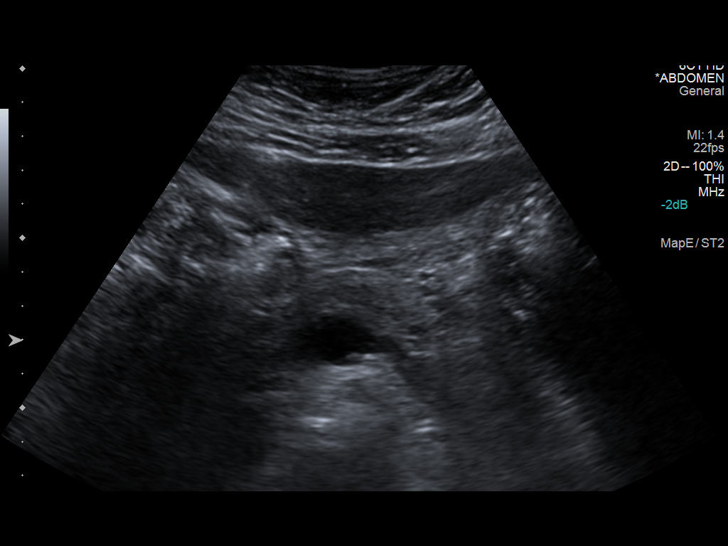
[im 27/65]
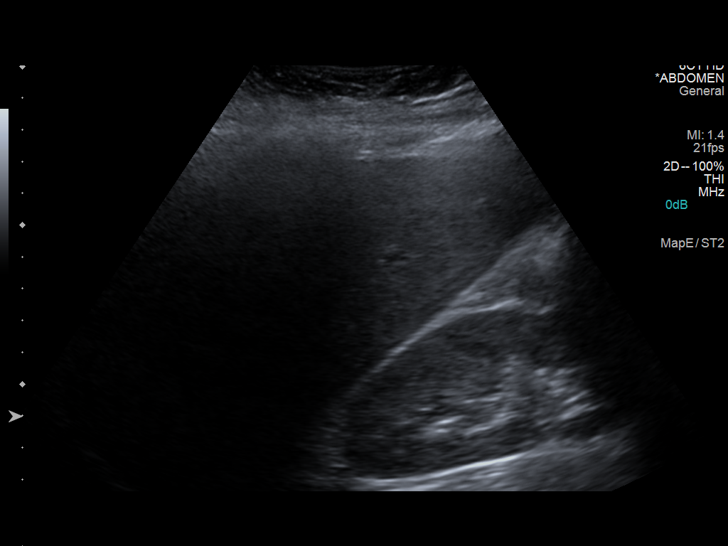
[im 33/65]
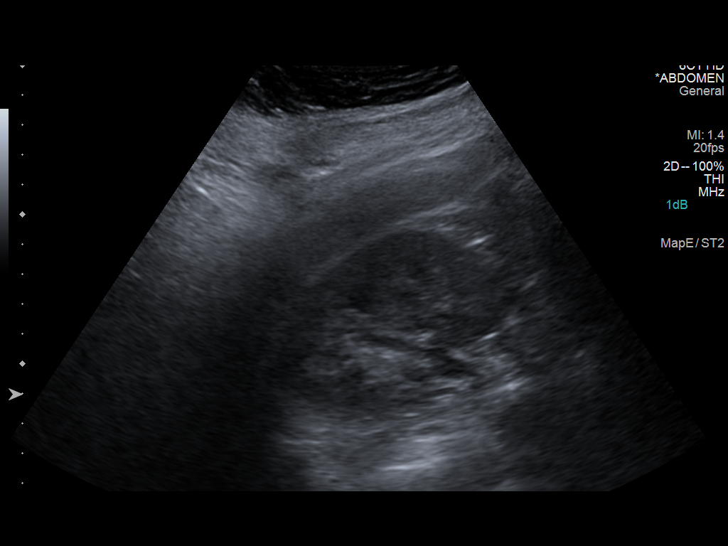
[im 38/65]
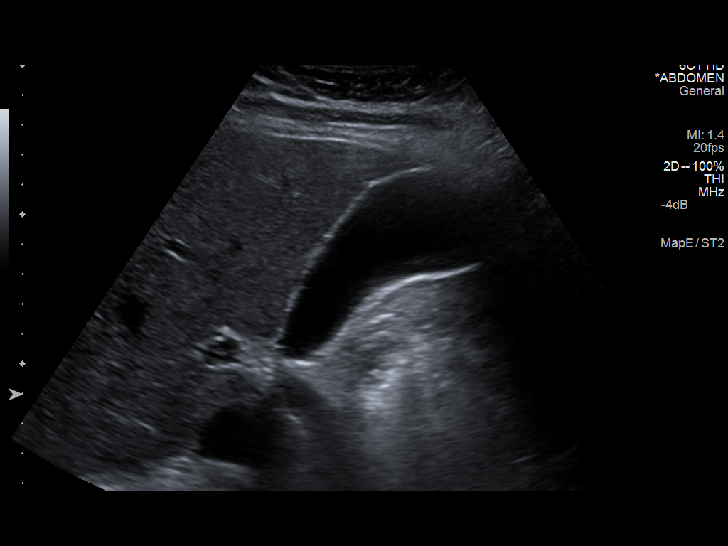
[im 43/65]
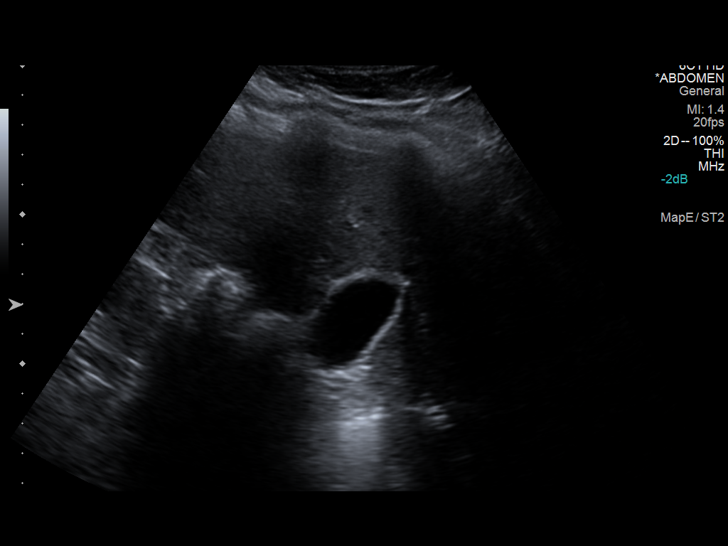
[im 49/65]
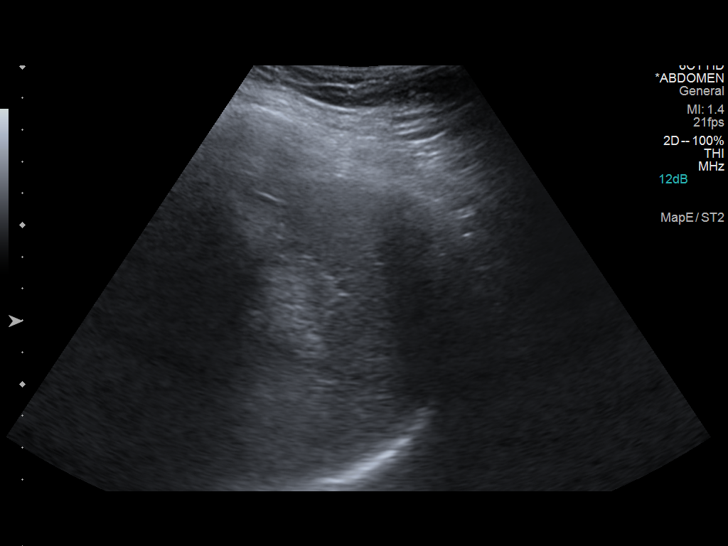
[im 54/65]
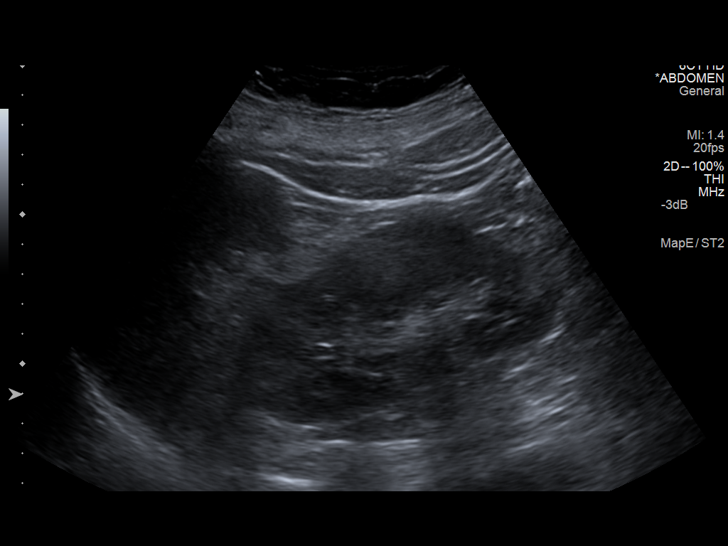
[im 59/65]
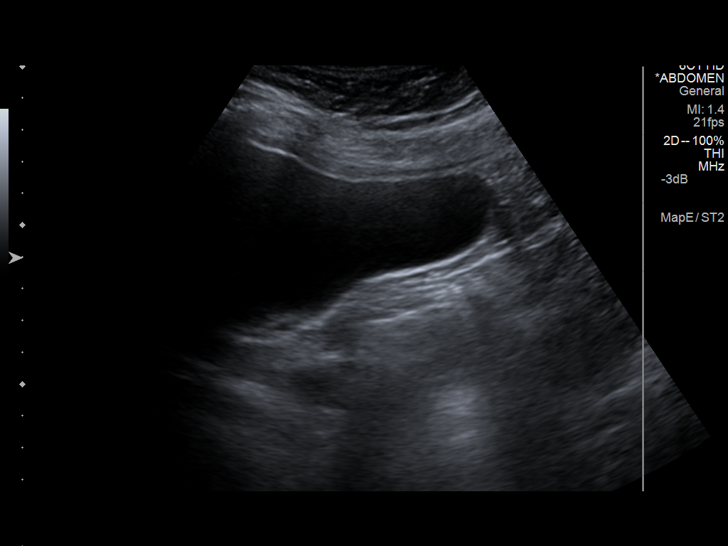
[im 65/65]
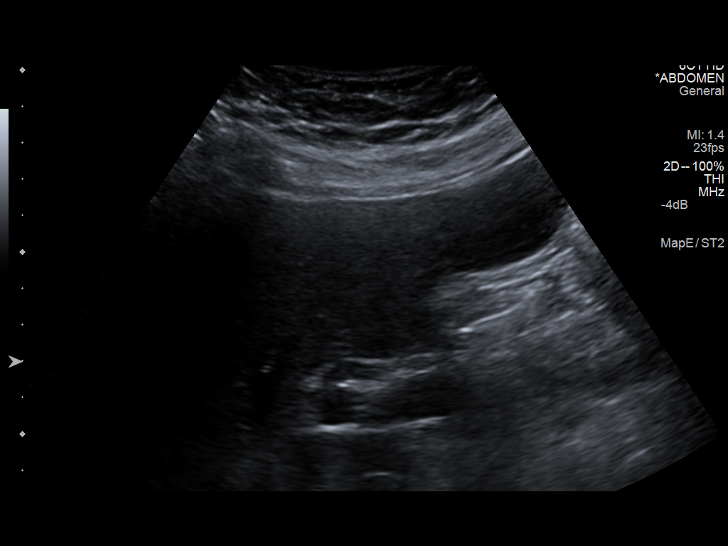

[13 of 25 positions shown; findings below may reference images not displayed]

FINDINGS: ULTRASOUND ABDOMEN

Gallbladder: No gallstones or wall thickening visualized. No
sonographic Murphy sign noted.

Common bile duct: Diameter: 4 mm

Liver: No focal lesion identified. Within normal limits in
parenchymal echogenicity.

IVC: No abnormality visualized.

Pancreas: Visualized portion unremarkable.

Spleen: Size and appearance within normal limits.

Right Kidney: Length: 12.1 cm. Echogenicity within normal limits. No
mass or hydronephrosis visualized.

Left Kidney: Length: 11.7 cm. Echogenicity within normal limits. No
mass or hydronephrosis visualized.

Abdominal aorta: No aneurysm visualized.

Other findings: None.

ULTRASOUND HEPATIC ELASTOGRAPHY

Device: Siemens Helix VTQ

Transducer 4 V1

Patient position: Left lateral decubitus

Hepatic Segment:  8

Number of measurements:  10

Median velocity:   1.37  m/sec

IQR/Median velocity ratio

Corresponding Metavir fibrosis score:  F2 +some F3

Risk of fibrosis: Moderate

Limitations of exam: Patient could not follow instructions to
suspend respiration

Pertinent findings noted on other imaging exams:  None
IMPRESSION: Negative abdominal ultrasound. No sonographic abnormality
identified.

Median hepatic shear wave velocity is calculated at 1.37 m/sec.

Corresponding Metavir fibrosis score is F2 + some F3.

Risk of fibrosis is moderate.

Please note that abnormal shear wave velocities may also be
identified in clinical settings other than with hepatic fibrosis,
such as: acute hepatitis, elevated right heart and central venous
pressures, Tiburon disease (Fan), infiltrative
processes such as mastocytosis/amyloidosis/infiltrative tumor,
extrahepatic cholestasis, in the post-prandial state, and liver
transplantation. Correlation with patient history, laboratory data,
and clinical condition recommended.

## 2018-04-02 ENCOUNTER — Other Ambulatory Visit: Payer: Self-pay

## 2018-04-02 ENCOUNTER — Emergency Department (HOSPITAL_COMMUNITY)
Admission: EM | Admit: 2018-04-02 | Discharge: 2018-04-02 | Disposition: A | Discharge status: Home / Self Care | Payer: Medicaid Other | Attending: Emergency Medicine | Admitting: Emergency Medicine

## 2018-04-02 DIAGNOSIS — F17210 Nicotine dependence, cigarettes, uncomplicated: Secondary | ICD-10-CM | POA: Insufficient documentation

## 2018-04-02 DIAGNOSIS — I10 Essential (primary) hypertension: Secondary | ICD-10-CM | POA: Insufficient documentation

## 2018-04-02 DIAGNOSIS — Z79899 Other long term (current) drug therapy: Secondary | ICD-10-CM | POA: Insufficient documentation

## 2018-04-02 DIAGNOSIS — M791 Myalgia, unspecified site: Secondary | ICD-10-CM | POA: Diagnosis not present

## 2018-04-02 DIAGNOSIS — R200 Anesthesia of skin: Secondary | ICD-10-CM | POA: Diagnosis present

## 2018-04-02 LAB — COMPREHENSIVE METABOLIC PANEL
ALK PHOS: 60 U/L (ref 38–126)
ALT: 31 U/L (ref 0–44)
AST: 29 U/L (ref 15–41)
Albumin: 4 g/dL (ref 3.5–5.0)
Anion gap: 10 (ref 5–15)
BILIRUBIN TOTAL: 1 mg/dL (ref 0.3–1.2)
BUN: 20 mg/dL (ref 6–20)
CALCIUM: 9.3 mg/dL (ref 8.9–10.3)
CO2: 26 mmol/L (ref 22–32)
Chloride: 101 mmol/L (ref 98–111)
Creatinine, Ser: 1.08 mg/dL (ref 0.61–1.24)
GFR calc Af Amer: >60 mL/min (ref >60)
GFR calc non Af Amer: >60 mL/min (ref >60)
Glucose, Bld: 95 mg/dL (ref 70–99)
Potassium: 3.8 mmol/L (ref 3.5–5.1)
Sodium: 137 mmol/L (ref 135–145)
TOTAL PROTEIN: 7.6 g/dL (ref 6.5–8.1)

## 2018-04-02 LAB — CBC WITH DIFFERENTIAL/PLATELET
Abs Immature Granulocytes: 0.02 10*3/uL (ref 0.00–0.07)
Basophils Absolute: 0 10*3/uL (ref 0.0–0.1)
Basophils Relative: 1 %
EOS PCT: 2 %
Eosinophils Absolute: 0.1 10*3/uL (ref 0.0–0.5)
HEMATOCRIT: 42.6 % (ref 39.0–52.0)
HEMOGLOBIN: 14.1 g/dL (ref 13.0–17.0)
Immature Granulocytes: 0 %
LYMPHS PCT: 19 %
Lymphs Abs: 1.5 10*3/uL (ref 0.7–4.0)
MCH: 28.5 pg (ref 26.0–34.0)
MCHC: 33.1 g/dL (ref 30.0–36.0)
MCV: 86.1 fL (ref 80.0–100.0)
MONO ABS: 1 10*3/uL (ref 0.1–1.0)
Monocytes Relative: 13 %
Neutro Abs: 5.1 10*3/uL (ref 1.7–7.7)
Neutrophils Relative %: 65 %
Platelets: 245 10*3/uL (ref 150–400)
RBC: 4.95 MIL/uL (ref 4.22–5.81)
RDW: 14.2 % (ref 11.5–15.5)
WBC: 7.7 10*3/uL (ref 4.0–10.5)
nRBC: 0 % (ref 0.0–0.2)

## 2018-04-02 LAB — SEDIMENTATION RATE: Sed Rate: 13 mm/hr (ref 0–16)

## 2018-04-02 MED ORDER — HYDROMORPHONE HCL 4 MG PO TABS: 4.0000 mg | ORAL_TABLET | Freq: Four times a day (QID) | ORAL | 0 refills | Status: DC | PRN

## 2018-04-02 MED ORDER — SODIUM CHLORIDE 0.9 % IV SOLN
INTRAVENOUS | Status: DC
  Administered 2018-04-02: 16:00:00 via INTRAVENOUS

## 2018-04-02 MED ORDER — HYDROMORPHONE HCL 1 MG/ML IJ SOLN
0.5000 mg | Freq: Once | INTRAMUSCULAR | Status: AC
  Administered 2018-04-02: 0.5 mg via INTRAVENOUS
  Filled 2018-04-02: qty 1

## 2018-04-02 NOTE — ED Provider Notes (Addendum)
[facility] EMERGENCY DEPARTMENT Provider Note   CSN: [id number] Arrival date & time: 04/02/18  1356     History   Chief Complaint Chief Complaint  Patient presents with  . Numbness    Bilateral thighs    HPI Reginald Martin is a 57 y.o. male.  Patient brought in by EMS.  Patient stated that he was in squeezing in his thighs.  Also stated that he had body aches all over.  He thought he was having a stroke because he was having all of this extremity pain.  States he has stroke 6 months ago.  But did not seek medical treatment.  EMS noted blood sugar was 123.  Patient denied any headache denied any visual changes denied any weakness or numbness.  Denied any neck or back pain other than that he had generalized myalgia.  Most of the increased pain was in the anterior thigh area bilaterally.  Patient nontoxic no acute distress in appearance.  Patient appeared comfortable.  Onset of symptoms was just today.     Past Medical History:  Diagnosis Date  . Hepatitis C   . Hypertension   . Osteoporosis     Patient Active Problem List   Diagnosis Date Noted  . Chronic hepatitis C without hepatic coma (HCC)   . Osteoporosis     No past surgical history on file.      Home Medications    Prior to Admission medications   Medication Sig Start Date End Date Taking? Authorizing Provider  amLODipine-benazepril (LOTREL) 10-40 MG capsule Take 1 capsule by mouth daily.   Yes [provider]  cholecalciferol (VITAMIN D) 1000 units tablet Take 1,000 Units by mouth daily.   Yes [provider]  divalproex (DEPAKOTE) 500 MG DR tablet Take 500 mg by mouth 2 (two) times daily.   Yes [provider]  hydrochlorothiazide (HYDRODIURIL) 25 MG tablet Take 25 mg by mouth daily.   Yes [provider]  OLANZapine (ZYPREXA) 20 MG tablet Take 20 mg by mouth daily.    Yes [provider]  traZODone (DESYREL) 50 MG tablet Take 50 mg by mouth at  bedtime.   Yes [provider]  trihexyphenidyl (ARTANE) 2 MG tablet Take 2 mg by mouth 2 (two) times daily.   Yes [provider]  HYDROmorphone (DILAUDID) 4 MG tablet Take 1 tablet (4 mg total) by mouth every 6 (six) hours as needed for severe pain. 04/02/18   Vanetta Mulders, MD    Family History No family history on file.  Social History Social History   Tobacco Use  . Smoking status: Current Every Day Smoker    Packs/day: 0.25    Types: Cigarettes  . Smokeless tobacco: Never Used  . Tobacco comment: trying to cut back  Substance Use Topics  . Alcohol use: No    Alcohol/week: 0.0 standard drinks  . Drug use: No     Allergies   Tylenol [acetaminophen]   Review of Systems Review of Systems  Constitutional: Negative for fever.  HENT: Negative for congestion, sore throat and trouble swallowing.   Eyes: Negative for visual disturbance.  Respiratory: Negative for shortness of breath.   Cardiovascular: Negative for chest pain.  Gastrointestinal: Negative for abdominal pain.  Genitourinary: Negative for dysuria and hematuria.  Musculoskeletal: Positive for myalgias.  Skin: Negative for rash.  Neurological: Negative for dizziness, syncope, facial asymmetry, speech difficulty, numbness and headaches.  Hematological: Does not bruise/bleed easily.  Psychiatric/Behavioral: Negative for confusion.  Physical Exam Updated Vital Signs BP (!) 143/80   Pulse 68   Temp 98.1 F (36.7 C) (Oral)   Resp 16   SpO2 92%   Physical Exam  Constitutional: He is oriented to person, place, and time. He appears well-developed and well-nourished. No distress.  HENT:  Head: Normocephalic and atraumatic.  Mouth/Throat: Oropharynx is clear and moist.  Eyes: Pupils are equal, round, and reactive to light. Conjunctivae and EOM are normal.  Neck: Normal range of motion. Neck supple.  Cardiovascular: Normal rate, regular rhythm and normal heart sounds.  Pulmonary/Chest:  Effort normal and breath sounds normal.  Abdominal: Soft. Bowel sounds are normal. There is no tenderness.  Musculoskeletal: Normal range of motion. He exhibits no edema, tenderness or deformity.  Neurological: He is alert and oriented to person, place, and time. No cranial nerve deficit or sensory deficit. He exhibits normal muscle tone. Coordination normal.  Skin: Skin is warm. Capillary refill takes less than 2 seconds. No rash noted.  Nursing note and vitals reviewed.    ED Treatments / Results  Labs (all labs ordered are listed, but only abnormal results are displayed) Labs Reviewed  CBC WITH DIFFERENTIAL/PLATELET  COMPREHENSIVE METABOLIC PANEL  SEDIMENTATION RATE    EKG None  Radiology No results found.  Procedures Procedures (including critical care time)  Medications Ordered in ED Medications  0.9 %  sodium chloride infusion ( Intravenous New Bag/Given 04/02/18 1620)  HYDROmorphone (DILAUDID) injection 0.5 mg (0.5 mg Intravenous Given 04/02/18 1618)     Initial Impression / Assessment and Plan / ED Course  I have reviewed the triage vital signs and the nursing notes.  Pertinent labs & imaging results that were available during my care of the patient were reviewed by me and considered in my medical decision making (see chart for details).    Labs including sedimentation rate without any significant abnormalities.  Labs were normal.  Patient given pain medicine and all symptoms resolved.  Patient discharged home given follow-up to the local clinic.  Patient discharged home with a short course of pain medicine.  For any recurrent symptoms.  Patient nontoxic no acute distress.  Patient did not have any fevers here.  Vital signs were normal oxygen saturations were in the upper 90s.   Final Clinical Impressions(s) / ED Diagnoses   Final diagnoses:  Myalgia    ED Discharge Orders         Ordered    HYDROmorphone (DILAUDID) 4 MG tablet  Every 6 hours PRN      04/02/18 1919           Vanetta Mulders, MD 04/10/18 Geronimo Boot    Vanetta Mulders, MD 04/10/18 [phone]

## 2018-04-02 NOTE — Discharge Instructions (Signed)
Take the pain medicine as needed.  Make an appointment to follow-up either with your doctor or the wellness clinic.  Return for any new or worse symptoms.  Today's labs without any significant abnormality.

## 2018-04-02 NOTE — ED Triage Notes (Signed)
Patient presents to [facility] via EMS with C/O Squeezing in his thighs.  Patient States that he is having a stroke.  States that he had a stroke about 6 months ago but never sought medical care.  CBG - 123 per EMS.

## 2018-04-02 NOTE — ED Notes (Signed)
Pt ambulatory to bathroom with no reported issues. 

## 2018-04-02 NOTE — ED Notes (Signed)
Discharge instructions and prescriptions discussed with Pt. Pt verbalized understanding. Pt stable and ambulatory.   

## 2018-04-02 NOTE — ED Notes (Signed)
Pt given ginger ale and turkey sandwich 

## 2020-08-20 ENCOUNTER — Other Ambulatory Visit: Payer: Self-pay

## 2020-08-20 ENCOUNTER — Ambulatory Visit: Payer: Medicaid Other | Admitting: Podiatry

## 2020-08-20 DIAGNOSIS — B351 Tinea unguium: Secondary | ICD-10-CM | POA: Diagnosis not present

## 2020-08-20 MED ORDER — CICLOPIROX 8 % EX SOLN: Freq: Every day | CUTANEOUS | 4 refills | Status: DC

## 2020-08-23 NOTE — Progress Notes (Signed)
  Subjective:  Patient ID: Reginald Martin, male    DOB: 06/18/1961,  MRN: [id number]  Chief Complaint  Patient presents with  . Nail Problem    Nails are long and discolored     60 y.o. male presents with the above complaint. History confirmed with patient.   Objective:  Physical Exam: warm, good capillary refill, no trophic changes or ulcerative lesions, normal DP and PT pulses and normal sensory exam.  Yellow and brown discoloration of the toenails  Assessment:   1. Onychomycosis      Plan:  Patient was evaluated and treated and all questions answered.  Discussed the etiology and treatment options for the condition in detail with the patient. Educated patient on the topical and oral treatment options for mycotic nails. Recommended debridement of the nails today. Sharp and mechanical debridement performed of all painful and mycotic nails today. Nails debrided in length and thickness using a nail nipper to level of comfort. Discussed treatment options including appropriate shoe gear. Follow up as needed for painful nails.  Discussed with him that with his medical history does not qualify for at risk routine regular foot care with Medicaid, we could provide this for him for a fee out-of-pocket but otherwise he should return as needed.  Prescription for ciclopirox sent to his pharmacy.  Return if symptoms worsen or fail to improve.

## 2021-02-04 ENCOUNTER — Other Ambulatory Visit: Payer: Self-pay | Admitting: Podiatry

## 2021-02-04 NOTE — Telephone Encounter (Signed)
Please advise 

## 2021-05-15 ENCOUNTER — Emergency Department (HOSPITAL_COMMUNITY)
Admission: EM | Admit: 2021-05-15 | Discharge: 2021-05-15 | Disposition: A | Discharge status: Home / Self Care | Payer: Medicaid Other | Attending: Emergency Medicine | Admitting: Emergency Medicine

## 2021-05-15 ENCOUNTER — Other Ambulatory Visit: Payer: Self-pay

## 2021-05-15 ENCOUNTER — Encounter (HOSPITAL_COMMUNITY): Payer: Self-pay | Admitting: Emergency Medicine

## 2021-05-15 DIAGNOSIS — I10 Essential (primary) hypertension: Secondary | ICD-10-CM | POA: Insufficient documentation

## 2021-05-15 DIAGNOSIS — F17210 Nicotine dependence, cigarettes, uncomplicated: Secondary | ICD-10-CM | POA: Insufficient documentation

## 2021-05-15 DIAGNOSIS — X102XXA Contact with fats and cooking oils, initial encounter: Secondary | ICD-10-CM | POA: Diagnosis not present

## 2021-05-15 DIAGNOSIS — Z23 Encounter for immunization: Secondary | ICD-10-CM | POA: Diagnosis not present

## 2021-05-15 DIAGNOSIS — Z79899 Other long term (current) drug therapy: Secondary | ICD-10-CM | POA: Insufficient documentation

## 2021-05-15 DIAGNOSIS — T23231A Burn of second degree of multiple right fingers (nail), not including thumb, initial encounter: Secondary | ICD-10-CM | POA: Insufficient documentation

## 2021-05-15 DIAGNOSIS — T23001A Burn of unspecified degree of right hand, unspecified site, initial encounter: Secondary | ICD-10-CM | POA: Diagnosis present

## 2021-05-15 MED ORDER — OXYCODONE HCL 5 MG PO TABS: 5.0000 mg | ORAL_TABLET | Freq: Four times a day (QID) | ORAL | 0 refills | Status: DC | PRN

## 2021-05-15 MED ORDER — OXYCODONE HCL 5 MG PO TABS
5.0000 mg | ORAL_TABLET | Freq: Once | ORAL | Status: AC
  Administered 2021-05-15: 5 mg via ORAL
  Filled 2021-05-15: qty 1

## 2021-05-15 MED ORDER — TETANUS-DIPHTH-ACELL PERTUSSIS 5-2.5-18.5 LF-MCG/0.5 IM SUSY
0.5000 mL | PREFILLED_SYRINGE | Freq: Once | INTRAMUSCULAR | Status: AC
  Administered 2021-05-15: 0.5 mL via INTRAMUSCULAR
  Filled 2021-05-15: qty 0.5

## 2021-05-15 MED ORDER — CEPHALEXIN 500 MG PO CAPS: 500.0000 mg | ORAL_CAPSULE | Freq: Three times a day (TID) | ORAL | 0 refills | Status: DC

## 2021-05-15 NOTE — Discharge Instructions (Signed)
Please continue to clean your hand daily with gentle soap and water.  Apply Neosporin over the wound and to decrease risk of infection.  Take antibiotic as prescribed, take opiate pain medication as needed but be aware that it can cause drowsiness

## 2021-05-15 NOTE — ED Triage Notes (Addendum)
Pt to triage via [facility] from home.  States he spilled oil on R hand cooking french fries on Sunday.  States he has been putting rubbing alcohol on it.

## 2021-05-15 NOTE — ED Notes (Signed)
Discharged by PA at triage. 
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
## 2021-07-07 ENCOUNTER — Other Ambulatory Visit: Payer: Self-pay | Admitting: Podiatry

## 2021-07-08 ENCOUNTER — Telehealth: Payer: Self-pay | Admitting: Podiatry

## 2021-07-08 NOTE — Telephone Encounter (Signed)
Patient has not been seen since 08/20/20,please schedule for a refill f/u appointment

## 2021-07-08 NOTE — Telephone Encounter (Signed)
Pt called to request a refill. Dr. Lilian Kapur would need to evaluate him first due to his last time being seen with [place] was 3//22. I am unable to reach him to schedule an appt for him to come see [place] due to a number not being on file for him.

## 2021-07-08 NOTE — Telephone Encounter (Signed)
Schedule appointment?

## 2021-09-21 ENCOUNTER — Other Ambulatory Visit: Payer: Self-pay | Admitting: Podiatry

## 2022-10-31 ENCOUNTER — Other Ambulatory Visit (HOSPITAL_COMMUNITY)
Admission: EM | Admit: 2022-10-31 | Discharge: 2022-11-04 | Payer: Medicaid Other | Attending: Psychiatry | Admitting: Psychiatry

## 2022-10-31 ENCOUNTER — Encounter (HOSPITAL_COMMUNITY): Payer: Self-pay | Admitting: Family

## 2022-10-31 DIAGNOSIS — B182 Chronic viral hepatitis C: Secondary | ICD-10-CM

## 2022-10-31 DIAGNOSIS — F1410 Cocaine abuse, uncomplicated: Secondary | ICD-10-CM | POA: Diagnosis not present

## 2022-10-31 DIAGNOSIS — F1010 Alcohol abuse, uncomplicated: Secondary | ICD-10-CM | POA: Diagnosis not present

## 2022-10-31 DIAGNOSIS — F1290 Cannabis use, unspecified, uncomplicated: Secondary | ICD-10-CM

## 2022-10-31 DIAGNOSIS — F1090 Alcohol use, unspecified, uncomplicated: Secondary | ICD-10-CM | POA: Diagnosis present

## 2022-10-31 LAB — POCT URINE DRUG SCREEN - MANUAL ENTRY (I-SCREEN)
POC Amphetamine UR: NOT DETECTED
POC Buprenorphine (BUP): NOT DETECTED
POC Cocaine UR: POSITIVE — AB
POC Marijuana UR: POSITIVE — AB
POC Methadone UR: NOT DETECTED
POC Methamphetamine UR: NOT DETECTED
POC Morphine: NOT DETECTED
POC Oxazepam (BZO): NOT DETECTED
POC Oxycodone UR: NOT DETECTED
POC Secobarbital (BAR): NOT DETECTED

## 2022-10-31 LAB — COMPREHENSIVE METABOLIC PANEL
ALT: 16 U/L (ref 0–44)
AST: 17 U/L (ref 15–41)
Albumin: 3.6 g/dL (ref 3.5–5.0)
Alkaline Phosphatase: 52 U/L (ref 38–126)
Anion gap: 9 (ref 5–15)
BUN: 10 mg/dL (ref 8–23)
CO2: 24 mmol/L (ref 22–32)
Calcium: 8.9 mg/dL (ref 8.9–10.3)
Chloride: 105 mmol/L (ref 98–111)
Creatinine, Ser: 0.99 mg/dL (ref 0.61–1.24)
GFR, Estimated: >60 mL/min (ref >60)
Glucose, Bld: 91 mg/dL (ref 70–99)
Potassium: 3.8 mmol/L (ref 3.5–5.1)
Sodium: 138 mmol/L (ref 135–145)
Total Bilirubin: 0.6 mg/dL (ref 0.3–1.2)
Total Protein: 7 g/dL (ref 6.5–8.1)

## 2022-10-31 LAB — CBC WITH DIFFERENTIAL/PLATELET
Abs Immature Granulocytes: 0.02 10*3/uL (ref 0.00–0.07)
Basophils Absolute: 0 10*3/uL (ref 0.0–0.1)
Basophils Relative: 1 %
Eosinophils Absolute: 0.1 10*3/uL (ref 0.0–0.5)
Eosinophils Relative: 2 %
HCT: 39.3 % (ref 39.0–52.0)
Hemoglobin: 12.9 g/dL — ABNORMAL LOW (ref 13.0–17.0)
Immature Granulocytes: 0 %
Lymphocytes Relative: 30 %
Lymphs Abs: 1.6 10*3/uL (ref 0.7–4.0)
MCH: 28.9 pg (ref 26.0–34.0)
MCHC: 32.8 g/dL (ref 30.0–36.0)
MCV: 88.1 fL (ref 80.0–100.0)
Monocytes Absolute: 0.4 10*3/uL (ref 0.1–1.0)
Monocytes Relative: 8 %
Neutro Abs: 3 10*3/uL (ref 1.7–7.7)
Neutrophils Relative %: 59 %
Platelets: 296 10*3/uL (ref 150–400)
RBC: 4.46 MIL/uL (ref 4.22–5.81)
RDW: 14.5 % (ref 11.5–15.5)
WBC: 5.2 10*3/uL (ref 4.0–10.5)
nRBC: 0 % (ref 0.0–0.2)

## 2022-10-31 LAB — MAGNESIUM: Magnesium: 1.9 mg/dL (ref 1.7–2.4)

## 2022-10-31 LAB — ETHANOL: Alcohol, Ethyl (B): <10 mg/dL (ref <10)

## 2022-10-31 LAB — TSH: TSH: 1.406 u[IU]/mL (ref 0.350–4.500)

## 2022-10-31 LAB — VALPROIC ACID LEVEL: Valproic Acid Lvl: 52 ug/mL (ref 50.0–100.0)

## 2022-10-31 MED ORDER — THIAMINE HCL 100 MG/ML IJ SOLN
100.0000 mg | Freq: Once | INTRAMUSCULAR | Status: AC
  Administered 2022-10-31: 100 mg via INTRAMUSCULAR
  Filled 2022-10-31: qty 2

## 2022-10-31 MED ORDER — LORAZEPAM 1 MG PO TABS: 1.0000 mg | ORAL_TABLET | Freq: Every day | ORAL | Status: DC

## 2022-10-31 MED ORDER — TRAZODONE HCL 50 MG PO TABS
50.0000 mg | ORAL_TABLET | Freq: Every evening | ORAL | Status: DC | PRN
  Administered 2022-10-31 – 2022-11-03 (×4): 50 mg via ORAL
  Filled 2022-10-31: qty 1
  Filled 2022-10-31: qty 14
  Filled 2022-10-31: qty 1
  Filled 2022-10-31: qty 21
  Filled 2022-10-31 (×2): qty 1

## 2022-10-31 MED ORDER — ONDANSETRON 4 MG PO TBDP: 4.0000 mg | ORAL_TABLET | Freq: Four times a day (QID) | ORAL | Status: DC | PRN

## 2022-10-31 MED ORDER — ADULT MULTIVITAMIN W/MINERALS CH
1.0000 | ORAL_TABLET | Freq: Every day | ORAL | Status: DC
  Administered 2022-10-31 – 2022-11-04 (×5): 1 via ORAL
  Filled 2022-10-31 (×5): qty 1

## 2022-10-31 MED ORDER — MAGNESIUM HYDROXIDE 400 MG/5ML PO SUSP: 30.0000 mL | Freq: Every day | ORAL | Status: DC | PRN

## 2022-10-31 MED ORDER — DIVALPROEX SODIUM 500 MG PO DR TAB
500.0000 mg | DELAYED_RELEASE_TABLET | Freq: Two times a day (BID) | ORAL | Status: DC
  Administered 2022-10-31 – 2022-11-04 (×8): 500 mg via ORAL
  Filled 2022-10-31: qty 28
  Filled 2022-10-31 (×7): qty 1
  Filled 2022-10-31: qty 42
  Filled 2022-10-31: qty 1

## 2022-10-31 MED ORDER — ALUM & MAG HYDROXIDE-SIMETH 200-200-20 MG/5ML PO SUSP: 30.0000 mL | ORAL | Status: DC | PRN

## 2022-10-31 MED ORDER — HYDROXYZINE HCL 25 MG PO TABS: 25.0000 mg | ORAL_TABLET | Freq: Three times a day (TID) | ORAL | Status: DC | PRN

## 2022-10-31 MED ORDER — LOPERAMIDE HCL 2 MG PO CAPS: 2.0000 mg | ORAL_CAPSULE | ORAL | Status: DC | PRN

## 2022-10-31 MED ORDER — LORAZEPAM 1 MG PO TABS: 1.0000 mg | ORAL_TABLET | Freq: Two times a day (BID) | ORAL | Status: DC

## 2022-10-31 MED ORDER — LORAZEPAM 1 MG PO TABS
1.0000 mg | ORAL_TABLET | Freq: Three times a day (TID) | ORAL | Status: DC
  Administered 2022-11-01 (×2): 1 mg via ORAL
  Filled 2022-10-31 (×3): qty 1

## 2022-10-31 MED ORDER — TRIHEXYPHENIDYL HCL 2 MG PO TABS
2.0000 mg | ORAL_TABLET | Freq: Two times a day (BID) | ORAL | Status: DC
  Administered 2022-10-31 – 2022-11-04 (×8): 2 mg via ORAL
  Filled 2022-10-31 (×3): qty 1
  Filled 2022-10-31: qty 28
  Filled 2022-10-31 (×3): qty 1
  Filled 2022-10-31: qty 42
  Filled 2022-10-31 (×2): qty 1

## 2022-10-31 MED ORDER — LORAZEPAM 1 MG PO TABS
1.0000 mg | ORAL_TABLET | Freq: Four times a day (QID) | ORAL | Status: AC
  Administered 2022-10-31 – 2022-11-01 (×4): 1 mg via ORAL
  Filled 2022-10-31 (×4): qty 1

## 2022-10-31 MED ORDER — LORAZEPAM 1 MG PO TABS: 1.0000 mg | ORAL_TABLET | Freq: Four times a day (QID) | ORAL | Status: DC | PRN

## 2022-10-31 MED ORDER — THIAMINE MONONITRATE 100 MG PO TABS
100.0000 mg | ORAL_TABLET | Freq: Every day | ORAL | Status: DC
  Administered 2022-11-01 – 2022-11-04 (×4): 100 mg via ORAL
  Filled 2022-10-31 (×4): qty 1

## 2022-10-31 NOTE — Group Note (Unsigned)
Group Topic: Relaxation  Group Date: 10/31/2022 Start Time: 2030 End Time: 2050 Facilitators: Levander Campion  Department: [facility]  Number of Participants: 7  Group Focus: anxiety and coping skills Treatment Modality:  Individual Therapy Interventions utilized were assignment, patient education, and problem solving Purpose: enhance coping skills   Name: Reginald Martin Date of Birth: 08-Feb-1961  MR: [id number]    Level of Participation: {THERAPIES; PSYCH GROUP PARTICIPATION NWGNF:62130} Quality of Participation: {THERAPIES; PSYCH QUALITY OF PARTICIPATION:23992} Interactions with others: {THERAPIES; PSYCH INTERACTIONS:23993} Mood/Affect: {THERAPIES; PSYCH MOOD/AFFECT:23994} Triggers (if applicable): *** Cognition: {THERAPIES; PSYCH COGNITION:23995} Progress: {THERAPIES; PSYCH PROGRESS:23997} Response: *** Plan: {THERAPIES; PSYCH QMVH:84696}  Patients Problems:  Patient Active Problem List   Diagnosis Date Noted   Alcohol use disorder 10/31/2022   Chronic hepatitis C without hepatic coma (HCC)    Osteoporosis

## 2022-10-31 NOTE — ED Notes (Signed)
Patient was provided with dinner 

## 2022-10-31 NOTE — ED Notes (Signed)
Patient observed/assessed in room in bed appearing in no immediate distress resting peacefully. Q15 minute checks continued by MHT and nursing staff. Will continue to monitor and support. 

## 2022-10-31 NOTE — ED Notes (Signed)
Pt is currently sleeping, no distress noted, environmental check complete, will continue to monitor patient for safety.  

## 2022-10-31 NOTE — ED Notes (Signed)
Snacks given 

## 2022-10-31 NOTE — Progress Notes (Addendum)
   10/31/22 1102  [facility] Triage Screening (Walk-ins at [facility] only)  How Did You Hear About [place]? DSS  What Is the Reason for Your Visit/Call Today? Pt presents to [facility] accompanied by his Child psychotherapist seeking substance use treatment.  Pt reports daily cocaine(1/2 gram), mairjuana (2 blunts) and alcohol (3-4 cans of beer 12oz and 1 pint of liquor) use. Pt  reports he is diagnosed with schizophrenia and is prescribed medication for his symptoms but cannot recall the medication. Pt is seen at [facility] ACTT team. Pt denies SI/HI and AVH.  How Long Has This Been Causing You Problems? > than 6 months  Have You Recently Had Any Thoughts About Hurting Yourself? No  Are You Planning to Commit Suicide/Harm Yourself At This time? No  Have you Recently Had Thoughts About Hurting Someone Karolee Ohs? No  Are You Planning To Harm Someone At This Time? No  Are you currently experiencing any auditory, visual or other hallucinations? No  Have You Used Any Alcohol or Drugs in the Past 24 Hours? No  Do you have any current medical co-morbidities that require immediate attention? No  Clinician description of patient physical appearance/behavior: calm ,cooperative, difficulty walking due to leg pain  What Do You Feel Would Help You the Most Today? Alcohol or Drug Use Treatment  If access to [facility] was not available, would you have sought care in the Emergency Department? No  Determination of Need Routine (7 days)  Options For Referral Medication Management;Chemical Dependency Intensive Outpatient Therapy (CDIOP)

## 2022-10-31 NOTE — BH Assessment (Signed)
Comprehensive Clinical Assessment (CCA) Note  10/31/2022 Reginald Martin [id number]  Disposition: Per Doran Heater, NP, patient is recommended for admission to [facility].  The patient demonstrates the following risk factors for suicide: Chronic risk factors for suicide include: substance use disorder. Acute risk factors for suicide include: N/A. Protective factors for this patient include: positive social support, positive therapeutic relationship, and hope for the future. Considering these factors, the overall suicide risk at this point appears to be low. Patient is appropriate for outpatient follow up.  Chief Complaint:  Chief Complaint  Patient presents with   Addiction Problem   Visit Diagnosis:  Alcohol use disorder  Cocaine use disorder (HCC)  Marijuana use      CCA Screening, Triage and Referral (STR)  Patient Reported Information How did you hear about [place]? DSS  What Is the Reason for Your Visit/Call Today? Pt presents to [facility] accompanied by his Child psychotherapist seeking substance use treatment.  Pt reports daily cocaine(1/2 gram), mairjuana (2 blunts) and alcohol (3-4 cans of beer 12oz and 1 pint of liquor) use. Pt  reports he is diagnosed with schizophrenia and is prescribed medication for his symptoms but cannot recall the medication. Pt is seen at [facility] ACTT team. Pt denies SI/HI and AVH.  Reginald Martin is a 62 year old male presenting to [facility] voluntarily seeking substance use treatment. Patient reports he has been using cocaine/crack, THC and alcohol since he was 62 years old and this is his first time seeking treatment. Patient reports he uses crack and THC daily and alcohol about 3-4 times a week. Patient reports he lives alone, and his buddies come over and smoke crack with him that is how he is able to use crack every day. Patient reports he smoked about $60 worth of crack yesterday along with two 12-ounce beers and two blunts. Patient reports he has received ACTT services  for about 17-18 years.   Patient lives alone and denies access to firearms, he denies legal issues and he receives disability. Patient reports history of inpatient treatment "years ago". Patient reports compliance with his medications but does not know the name of his medications.   Patient is oriented to person, place and situation. Patient eye contact and speech is normal, his affect is appropriate with congruent mood. Patient denies SI, HI, AVH, SIB. Patient does not appear manic, psychotic or delusional during triage.    How Long Has This Been Causing You Problems? > than 6 months  What Do You Feel Would Help You the Most Today? Alcohol or Drug Use Treatment   Have You Recently Had Any Thoughts About Hurting Yourself? No  Are You Planning to Commit Suicide/Harm Yourself At This time? No   Flowsheet Row ED from 10/31/2022 in [facility] ED from 05/15/2021 in [facility] Emergency Department at [facility]  C-SSRS RISK CATEGORY No Risk No Risk       Have you Recently Had Thoughts About Hurting Someone Karolee Ohs? No  Are You Planning to Harm Someone at This Time? No  Explanation: n/a   Have You Used Any Alcohol or Drugs in the Past 24 Hours? Yes  What Did You Use and How Much? $60 worth of crack and and Two blunts   Do You Currently Have a Therapist/Psychiatrist? Yes  Name of Therapist/Psychiatrist: Name of Therapist/Psychiatrist: [facility] (ACTT)   Have You Been Recently Discharged From Any Office Practice or Programs? No  Explanation of Discharge From Practice/Program: NA  CCA Screening Triage Referral Assessment Type of Contact: Face-to-Face  Telemedicine Service Delivery:   Is this Initial or Reassessment?   Date Telepsych consult ordered in [facility]:    Time Telepsych consult ordered in [facility]:    Location of Assessment: [facility] Assessment Services  Provider Location: [facility] Assessment Services   Collateral Involvement:  ACTT   Does Patient Have a Automotive engineer Guardian? No  Legal Guardian Contact Information: NA  Copy of Legal Guardianship Form: -- (NA)  Legal Guardian Notified of Arrival: -- (NA)  Legal Guardian Notified of Pending Discharge: -- (NA)  If Minor and Not Living with Parent(s), Who has Custody? NA  Is CPS involved or ever been involved? Never  Is APS involved or ever been involved? Never   Patient Determined To Be At Risk for Harm To Self or Others Based on Review of Patient Reported Information or Presenting Complaint? No  Method: No Plan  Availability of Means: No access or NA  Intent: Vague intent or NA  Notification Required: No need or identified person  Additional Information for Danger to Others Potential: No data recorded Additional Comments for Danger to Others Potential: NA  Are There Guns or Other Weapons in Your Home? No  Types of Guns/Weapons: NA  Are These Weapons Safely Secured?                            -- (NA)  Who Could Verify You Are Able To Have These Secured: ACTT  Do You Have any Outstanding Charges, Pending Court Dates, Parole/Probation? DENIES  Contacted To Inform of Risk of Harm To Self or Others: -- (NA)    Does Patient Present under Involuntary Commitment? No    [place] of Residence: [place]   Patient Currently Receiving the Following Services: ACTT Psychologist, educational)   Determination of Need: Routine (7 days)   Options For Referral: Medication Management; Chemical Dependency Intensive Outpatient Therapy (CDIOP); Facility-Based Crisis     CCA Biopsychosocial Patient Reported Schizophrenia/Schizoaffective Diagnosis in Past: Yes   Strengths: MOTIVATED FOR TREATMENT   Mental Health Symptoms Depression:   None   Duration of Depressive symptoms:    Mania:   None   Anxiety:    Worrying; Tension   Psychosis:  No data recorded  Duration of Psychotic symptoms:    Trauma:   None   Obsessions:    None   Compulsions:   None   Inattention:   None   Hyperactivity/Impulsivity:   None   Oppositional/Defiant Behaviors:   None   Emotional Irregularity:   None   Other Mood/Personality Symptoms:   NA    Mental Status Exam Appearance and self-care  Stature:   Tall   Weight:   Average weight   Clothing:   Age-appropriate   Grooming:   Normal   Cosmetic use:   None   Posture/gait:   Normal   Motor activity:   Not Remarkable   Sensorium  Attention:   Normal   Concentration:   Normal   Orientation:   Person; Place   Recall/memory:   Normal   Affect and Mood  Affect:   Full Range   Mood:   Euthymic   Relating  Eye contact:   Normal   Facial expression:   Responsive   Attitude toward examiner:   Cooperative   Thought and Language  Speech flow:  Clear and Coherent   Thought content:   Appropriate to  Mood and Circumstances   Preoccupation:   None   Hallucinations:   None   Organization:   Coherent   [employer] of Knowledge:   Fair   Intelligence:   Average   Abstraction:   Normal   Judgement:   Fair   Dance movement psychotherapist:   Adequate   Insight:   Fair   Decision Making:   Normal   Social Functioning  Social Maturity:   Irresponsible   Social Judgement:   Normal   Stress  Stressors:  No data recorded  Coping Ability:   Normal   Skill Deficits:   None   Supports:   Friends/Service system (ACTT)     Religion: Religion/Spirituality Are You A Religious Person?: Yes What is Your Religious Affiliation?: Christian How Might This Affect Treatment?: NA  Leisure/Recreation: Leisure / Recreation Do You Have Hobbies?: Yes Leisure and Hobbies: ALCOHOL AND DRUGS  Exercise/Diet: Exercise/Diet Do You Exercise?: No Have You Gained or Lost A Significant Amount of Weight in the Past Six Months?: No Do You Follow a Special Diet?: No Do You Have Any Trouble Sleeping?: No   CCA  Employment/Education Employment/Work Situation: Employment / Work Systems developer: On disability Why is Patient on Disability: MENTAL HEALTH How Long has Patient Been on Disability: UNKNOWN Patient's Job has Been Impacted by Current Illness: No Has Patient ever Been in the [employer]?: No  Education: Education Is Patient Currently Attending School?: No Last Grade Completed: 11 Did You Attend College?: No Did You Have An Individualized Education Program (IIEP): No Did You Have Any Difficulty At School?: No Patient's Education Has Been Impacted by Current Illness: No   CCA Family/Childhood History Family and Relationship History: Family history Marital status: Single Does patient have children?: No  Childhood History:  Childhood History By whom was/is the patient raised?: Both parents Did patient suffer any verbal/emotional/physical/sexual abuse as a child?: No Did patient suffer from severe childhood neglect?: No Has patient ever been sexually abused/assaulted/raped as an adolescent or adult?: No Was the patient ever a victim of a crime or a disaster?: No Witnessed domestic violence?: No Has patient been affected by domestic violence as an adult?: No       CCA Substance Use Alcohol/Drug Use: Alcohol / Drug Use Pain Medications: SEE MAR Prescriptions: SEE MAR Over the Counter: SEE MAR History of alcohol / drug use?: Yes Longest period of sobriety (when/how long): UNKNOWN Withdrawal Symptoms: None Substance #1 Name of Substance 1: ALCOHOL 1 - Age of First Use: 15 1 - Amount (size/oz): VARIES 1 - Frequency: 3-4 DAYS A WEEK 1 - Duration: ONGOING 1 - Last Use / Amount: 10/30/22    2-12 OUNCE BEER 1 - Method of Aquiring: STORE 1- Route of Use: ORAL Substance #2 Name of Substance 2: CRACK/COCAINE 2 - Age of First Use: 15 2 - Amount (size/oz): .5-1 GRAM 2 - Frequency: DAILY 2 - Duration: ONGOING 2 - Last Use / Amount: 10/31/22   $60 2 - Method of  Aquiring: FRIENDS 2 - Route of Substance Use: SMOKING                     ASAM's:  Six Dimensions of Multidimensional Assessment  Dimension 1:  Acute Intoxication and/or Withdrawal Potential:      Dimension 2:  Biomedical Conditions and Complications:      Dimension 3:  Emotional, Behavioral, or Cognitive Conditions and Complications:     Dimension 4:  Readiness to Change:  Dimension 5:  Relapse, Continued use, or Continued Problem Potential:     Dimension 6:  Recovery/Living Environment:     ASAM Severity Score:    ASAM Recommended Level of Treatment: ASAM Recommended Level of Treatment: Level II Intensive Outpatient Treatment   Substance use Disorder (SUD) Substance Use Disorder (SUD)  Checklist Symptoms of Substance Use: Continued use despite having a persistent/recurrent physical/psychological problem caused/exacerbated by use, Continued use despite persistent or recurrent social, interpersonal problems, caused or exacerbated by use, Presence of craving or strong urge to use  Recommendations for Services/Supports/Treatments: Recommendations for Services/Supports/Treatments Recommendations For Services/Supports/Treatments: ACCTT (Assertive Community Treatment), Detox  Discharge Disposition: Discharge Disposition Medical Exam completed: Yes Disposition of Patient: Admit (FBC) Mode of transportation if patient is discharged/movement?: Car  DSM5 Diagnoses: Patient Active Problem List   Diagnosis Date Noted   Alcohol use disorder 10/31/2022   Chronic hepatitis C without hepatic coma (HCC)    Osteoporosis      Referrals to Alternative Service(s): Referred to Alternative Service(s):   Place:   Date:   Time:    Referred to Alternative Service(s):   Place:   Date:   Time:    Referred to Alternative Service(s):   Place:   Date:   Time:    Referred to Alternative Service(s):   Place:   Date:   Time:     Audree Camel, [facility]

## 2022-10-31 NOTE — ED Provider Notes (Cosign Needed Addendum)
Facility Based Crisis Admission H&P  Date: 10/31/22 Patient Name: Reginald Martin MRN: [id number] Chief Complaint: Alcohol use disorder, cocaine use disorder, marijuana use disorder  Diagnoses:  Final diagnoses:  Alcohol use disorder  Cocaine use disorder (HCC)  Marijuana use    HPI: Patient presents voluntarily to [facility] behavioral health for walk-in assessment.  Patient is accompanied by ACT team representative Reginald Martin [phone], who odes not remain present during assessment. Patient is assessed, face-to-face, by nurse practitioner. He is seated in assessment area, no acute distress. Consulted with provider, Dr.  Lucianne Muss, and chart reviewed on 10/31/2022. He  is alert and oriented, pleasant and cooperative during assessment.   Reginald Martin would like assistance with substance use.  He states "I have been using cocaine and alcohol for many years, the woman I am living with does drugs and this makes me want to do drugs, I need for her to move out."  Patient endorses use of alcohol daily.  Most recent alcohol use on yesterday.  Amount of use each day varies significantly, uses alcohol provided by friend who visit his home. He denies history of delirium tremens, denies history of alcohol-related seizure.  He does not usually use alcohol until blacking out.  He uses cocaine, and smokes crack cocaine an average of 4 or more times per week.  Most recent cocaine use on yesterday.  He also smokes marijuana daily.  Most recent marijuana use yesterday. He has engaged in residential substance use treatment has been more than 2 years ago.  Patient is committed to his sobriety.  Patient  presents with euthymic mood, congruent affect. He denies suicidal and homicidal ideations. Patient endorses 3 previous suicide attempts.  Most recent suicide attempt 2 years ago.  Denies history of self-harm.  Patient easily  contracts verbally for safety with this Clinical research associate.  Patient has normal speech and behavior.  He   denies auditory and visual hallucinations.  Patient is able to converse coherently with goal-directed thoughts and no distractibility or preoccupation.  Denies symptoms of paranoia.  Objectively there is no evidence of psychosis/mania or delusional thinking.  Patient endorses history of schizophrenia.  [facility] team confirm patient is compliant with long-acting injectable medication Invega Sustenna 100 mg monthly, next dose due on 11/18/2022.  He endorses previous inpatient psychiatric hospitalizations.  Unable to recall most recent inpatient hospitalization.  No family mental health or addiction history reported.  Reginald Martin resides with a roommate in [place].  He denies access to weapons.  He receives disability income.  Patient endorses average sleep and appetite.  Patient offered support and encouragement.  He gives verbal consent to speak with [facility] ACT team Representative Reginald Martin.  Reviewed treatment plan with Reginald Martin to include patient admission to facility based crisis unit today with projected discharge in approximately 4 days.  Patient verbalizes understanding of treatment plan.  He confirms desire to remain full CODE STATUS.  Reviewed medications, including hydroxyzine and lorazepam, discussed potential side effects and patient offered opportunity to ask questions.  PHQ 2-9:   Flowsheet Row ED from 10/31/2022 in [facility] ED from 05/15/2021 in [facility] Emergency Department at [facility]  C-SSRS RISK CATEGORY No Risk No Risk         Total Time spent with patient: 45 minutes  Musculoskeletal  Strength & Muscle Tone: within normal limits Gait & Station: normal Patient leans: N/A  Psychiatric Specialty Exam  Presentation General Appearance:  Appropriate for Environment; Casual  Eye Contact: Good  Speech: Clear and Coherent; Normal Rate  Speech Volume: Normal  Handedness: Right   Mood and Affect   Mood: Euthymic  Affect: Appropriate; Congruent   Thought Process  Thought Processes: Goal Directed; Coherent; Linear  Descriptions of Associations:Intact  Orientation:Full (Time, Place and Person)  Thought Content:Logical; WDL    Hallucinations:Hallucinations: None  Ideas of Reference:None  Suicidal Thoughts:Suicidal Thoughts: No  Homicidal Thoughts: No  Sensorium  Memory: Immediate Good; Recent Good  Judgment: Fair  Insight: Fair   Art therapist  Concentration: Good  Attention Span: Good  Recall: Good  Fund of Knowledge: Fair  Language: Fair   Psychomotor Activity  Psychomotor Activity: Psychomotor Activity: Normal   Assets  Assets: Communication Skills; Desire for Improvement; Financial Resources/Insurance; Housing; Physical Health; Resilience; Social Support   Sleep  Sleep: Sleep: Fair   Nutritional Assessment (For OBS and FBC admissions only) Has the patient had a weight loss or gain of 10 pounds or more in the last 3 months?: No Has the patient had a decrease in food intake/or appetite?: No Does the patient have dental problems?: No Does the patient have eating habits or behaviors that may be indicators of an eating disorder including binging or inducing vomiting?: No Has the patient recently lost weight without trying?: 0 Has the patient been eating poorly because of a decreased appetite?: 0 Malnutrition Screening Tool Score: 0    Physical Exam Vitals and nursing note reviewed.  Constitutional:      Appearance: Normal appearance. He is well-developed.  HENT:     Head: Normocephalic and atraumatic.     Nose: Nose normal.  Cardiovascular:     Rate and Rhythm: Normal rate.  Pulmonary:     Effort: Pulmonary effort is normal.  Musculoskeletal:        General: Normal range of motion.     Cervical back: Normal range of motion.  Skin:    General: Skin is warm and dry.  Neurological:     Mental Status: He is alert  and oriented to person, place, and time.  Psychiatric:        Attention and Perception: Attention and perception normal.        Mood and Affect: Mood and affect normal.        Speech: Speech normal.        Behavior: Behavior normal. Behavior is cooperative.        Thought Content: Thought content normal.        Cognition and Memory: Cognition and memory normal.    Review of Systems  Constitutional: Negative.   HENT: Negative.    Eyes: Negative.   Respiratory: Negative.    Cardiovascular: Negative.   Gastrointestinal: Negative.   Genitourinary: Negative.   Musculoskeletal: Negative.   Skin: Negative.   Neurological: Negative.   Psychiatric/Behavioral:  Positive for substance abuse.     Blood pressure (!) 140/99, pulse 82, temperature 97.7 F (36.5 C), temperature source Oral, resp. rate 18, SpO2 98 %. There is no height or weight on file to calculate BMI.  Past Psychiatric History: See above  Is the patient at risk to self? No  Has the patient been a risk to self in the past 6 months? No .    Has the patient been a risk to self within the distant past? Yes   Is the patient a risk to others? No   Has the patient been a risk to others in the past 6 months? No  Has the patient been a risk to others within the distant past? No   Past Medical History: Hepatitis C, osteoporosis, hypertension Family History: None reported Social History: Receives disability income, substance use disorder  Last Labs:  Admission on 10/31/2022  Component Date Value Ref Range Status   WBC 10/31/2022 5.2  4.0 - 10.5 K/uL Final   RBC 10/31/2022 4.46  4.22 - 5.81 MIL/uL Final   Hemoglobin 10/31/2022 12.9 (L)  13.0 - 17.0 g/dL Final   HCT 16/03/9603 39.3  39.0 - 52.0 % Final   MCV 10/31/2022 88.1  80.0 - 100.0 fL Final   MCH 10/31/2022 28.9  26.0 - 34.0 pg Final   MCHC 10/31/2022 32.8  30.0 - 36.0 g/dL Final   RDW 54/02/8118 14.5  11.5 - 15.5 % Final   Platelets 10/31/2022 296  150 - 400 K/uL  Final   nRBC 10/31/2022 0.0  0.0 - 0.2 % Final   Neutrophils Relative % 10/31/2022 59  % Final   Neutro Abs 10/31/2022 3.0  1.7 - 7.7 K/uL Final   Lymphocytes Relative 10/31/2022 30  % Final   Lymphs Abs 10/31/2022 1.6  0.7 - 4.0 K/uL Final   Monocytes Relative 10/31/2022 8  % Final   Monocytes Absolute 10/31/2022 0.4  0.1 - 1.0 K/uL Final   Eosinophils Relative 10/31/2022 2  % Final   Eosinophils Absolute 10/31/2022 0.1  0.0 - 0.5 K/uL Final   Basophils Relative 10/31/2022 1  % Final   Basophils Absolute 10/31/2022 0.0  0.0 - 0.1 K/uL Final   Immature Granulocytes 10/31/2022 0  % Final   Abs Immature Granulocytes 10/31/2022 0.02  0.00 - 0.07 K/uL Final   Performed at [facility], [address], [place], [place] [address]   Sodium 10/31/2022 138  135 - 145 mmol/L Final   Potassium 10/31/2022 3.8  3.5 - 5.1 mmol/L Final   Chloride 10/31/2022 105  98 - 111 mmol/L Final   CO2 10/31/2022 24  22 - 32 mmol/L Final   Glucose, Bld 10/31/2022 91  70 - 99 mg/dL Final   Glucose reference range applies only to samples taken after fasting for at least 8 hours.   BUN 10/31/2022 10  8 - 23 mg/dL Final   Creatinine, Ser 10/31/2022 0.99  0.61 - 1.24 mg/dL Final   Calcium 95/62/1308 8.9  8.9 - 10.3 mg/dL Final   Total Protein 65/78/4696 7.0  6.5 - 8.1 g/dL Final   Albumin 29/52/8413 3.6  3.5 - 5.0 g/dL Final   AST 24/40/1027 17  15 - 41 U/L Final   ALT 10/31/2022 16  0 - 44 U/L Final   Alkaline Phosphatase 10/31/2022 52  38 - 126 U/L Final   Total Bilirubin 10/31/2022 0.6  0.3 - 1.2 mg/dL Final   GFR, Estimated 10/31/2022 >60  >60 mL/min Final   Comment: (NOTE) Calculated using the CKD-EPI Creatinine Equation (2021)    Anion gap 10/31/2022 9  5 - 15 Final   Performed at [facility], [address] [place], [place] [address]   Magnesium 10/31/2022 1.9  1.7 - 2.4 mg/dL Final   Performed at [facility], [address], [place], [place] [address]   Alcohol, Ethyl (B) 10/31/2022  <10  <10 mg/dL Final   Comment: (NOTE) Lowest detectable limit for serum alcohol is 10 mg/dL.  For medical purposes only. Performed at [facility], [address] [place], [place] [address]    TSH  10/31/2022 1.406  0.350 - 4.500 uIU/mL Final   Comment: Performed by a 3rd Generation assay with a functional sensitivity of <=0.01 uIU/mL. Performed at [facility], [address] [place], [place] [address]    POC Amphetamine UR 10/31/2022 None Detected  NONE DETECTED (Cut Off Level 1000 ng/mL) Final   POC Secobarbital (BAR) 10/31/2022 None Detected  NONE DETECTED (Cut Off Level 300 ng/mL) Final   POC Buprenorphine (BUP) 10/31/2022 None Detected  NONE DETECTED (Cut Off Level 10 ng/mL) Final   POC Oxazepam (BZO) 10/31/2022 None Detected  NONE DETECTED (Cut Off Level 300 ng/mL) Final   POC Cocaine UR 10/31/2022 Positive (A)  NONE DETECTED (Cut Off Level 300 ng/mL) Final   POC Methamphetamine UR 10/31/2022 None Detected  NONE DETECTED (Cut Off Level 1000 ng/mL) Final   POC Morphine 10/31/2022 None Detected  NONE DETECTED (Cut Off Level 300 ng/mL) Final   POC Methadone UR 10/31/2022 None Detected  NONE DETECTED (Cut Off Level 300 ng/mL) Final   POC Oxycodone UR 10/31/2022 None Detected  NONE DETECTED (Cut Off Level 100 ng/mL) Final   POC Marijuana UR 10/31/2022 Positive (A)  NONE DETECTED (Cut Off Level 50 ng/mL) Final   Valproic Acid Lvl 10/31/2022 52  50.0 - 100.0 ug/mL Final   Performed at [facility], [address], [place], [place] [address]    Allergies: Tylenol [acetaminophen]  Medications:  Facility Ordered Medications  Medication   alum & mag hydroxide-simeth (MAALOX/MYLANTA) 200-200-20 MG/5ML suspension 30 mL   magnesium hydroxide (MILK OF MAGNESIA) suspension 30 mL   hydrOXYzine (ATARAX) tablet 25 mg   traZODone (DESYREL) tablet 50 mg   [COMPLETED] thiamine (VITAMIN B1) injection 100 mg   [START ON 11/01/2022] thiamine (VITAMIN B1) tablet 100 mg    multivitamin with minerals tablet 1 tablet   LORazepam (ATIVAN) tablet 1 mg   loperamide (IMODIUM) capsule 2-4 mg   ondansetron (ZOFRAN-ODT) disintegrating tablet 4 mg   LORazepam (ATIVAN) tablet 1 mg   Followed by   Melene Muller ON 11/01/2022] LORazepam (ATIVAN) tablet 1 mg   Followed by   Melene Muller ON 11/02/2022] LORazepam (ATIVAN) tablet 1 mg   Followed by   Melene Muller ON 11/04/2022] LORazepam (ATIVAN) tablet 1 mg   trihexyphenidyl (ARTANE) tablet 2 mg   divalproex (DEPAKOTE) DR tablet 500 mg   PTA Medications  Medication Sig   traZODone (DESYREL) 50 MG tablet Take 50 mg by mouth at bedtime.   trihexyphenidyl (ARTANE) 2 MG tablet Take 2 mg by mouth 2 (two) times daily.   divalproex (DEPAKOTE) 500 MG DR tablet Take 500 mg by mouth 2 (two) times daily.   INVEGA SUSTENNA 156 MG/ML SUSY injection Inject 156 mg into the muscle every 28 (twenty-eight) days.    Long Term Goals: Improvement in symptoms so as ready for discharge  Short Term Goals: Patient will verbalize feelings in meetings with treatment team members., Patient will attend at least of 50% of the groups daily., Pt will complete the PHQ9 on admission, day 3 and discharge., Patient will participate in completing the [place] Suicide Severity Rating Scale, Patient will score a low risk of violence for 24 hours prior to discharge, and Patient will take medications as prescribed daily.  Medical Decision Making  Patient admitted to facility based crisis unit.  He remains voluntary.  Laboratory studies ordered including CBC, CMP, ethanol, magnesium, prolactin, TSH and valproic acid level. Urine drug screen order initiated.  EKG ordered.  Labs 05/13/204 reviewed CMP- WNL  CBC- Hgb 12.9 Valproic acid- 52 UDS- +cocaine, +marijuana Ethanol less than 10   Current medications: -Acetaminophen 650 mg every 6 as needed/mild pain -Maalox 30 mL oral every 4 as needed/digestion -Magnesium hydroxide 30 mL daily as needed/mild constipation  Initiated  home medications: -Depakote DR 500 mg twice daily -Trazodone 50 mg nightly as needed/sleep -Trihexyphenidyl 2 mg twice daily  CIWA Ativan protocol initiated: -Loperamide 2 to 4 mg oral as needed/diarrhea or loose stools -Lorazepam 1 mg 4 times daily x4 doses, 1 mg 3 times daily x3 doses, 1 mg 2 times daily x2 doses, 1 mg daily x1 dose -Lorazepam 1 mg every 6 hours as needed CIWA greater than 10 -Multivitamin with minerals 1 tablet daily -Ondansetron disintegrating tablet 4 mg every 6 as needed/nausea or vomiting -Thiamine injection 100 mg IM once -Thiamine tablet 100 mg daily      Recommendations  Based on my evaluation the patient does not appear to have an emergency medical condition.  Lenard Lance, FNP 10/31/22  2:58 PM

## 2022-10-31 NOTE — ED Notes (Signed)
Patient has been brought on the unit, familiarized with unit and is now sitting in the dining room eating lunch

## 2022-11-01 DIAGNOSIS — F1410 Cocaine abuse, uncomplicated: Secondary | ICD-10-CM | POA: Diagnosis not present

## 2022-11-01 DIAGNOSIS — F1010 Alcohol abuse, uncomplicated: Secondary | ICD-10-CM | POA: Diagnosis not present

## 2022-11-01 DIAGNOSIS — F1290 Cannabis use, unspecified, uncomplicated: Secondary | ICD-10-CM | POA: Diagnosis not present

## 2022-11-01 LAB — PROLACTIN: Prolactin: 104 ng/mL — ABNORMAL HIGH (ref 3.6–25.2)

## 2022-11-01 MED ORDER — ALUM & MAG HYDROXIDE-SIMETH 200-200-20 MG/5ML PO SUSP: 30.0000 mL | ORAL | Status: DC | PRN

## 2022-11-01 MED ORDER — ONDANSETRON HCL 4 MG PO TABS: 8.0000 mg | ORAL_TABLET | Freq: Three times a day (TID) | ORAL | Status: DC | PRN

## 2022-11-01 MED ORDER — HYDROXYZINE HCL 25 MG PO TABS: 25.0000 mg | ORAL_TABLET | Freq: Three times a day (TID) | ORAL | Status: DC | PRN

## 2022-11-01 MED ORDER — ACETAMINOPHEN 325 MG PO TABS: 650.0000 mg | ORAL_TABLET | Freq: Four times a day (QID) | ORAL | Status: DC | PRN

## 2022-11-01 MED ORDER — BISMUTH SUBSALICYLATE 262 MG PO CHEW: 524.0000 mg | CHEWABLE_TABLET | ORAL | Status: DC | PRN

## 2022-11-01 MED ORDER — POLYETHYLENE GLYCOL 3350 17 G PO PACK: 17.0000 g | PACK | Freq: Every day | ORAL | Status: DC | PRN

## 2022-11-01 MED ORDER — SENNA 8.6 MG PO TABS: 1.0000 | ORAL_TABLET | Freq: Every evening | ORAL | Status: DC | PRN

## 2022-11-01 NOTE — Tx Team (Signed)
LCSW met with patient to assess current mood, affect, physical state, and inquire about needs/goals while here in [facility] and after discharge. Patient reports he presented due to needing help with his alcohol and substance use. Patient reports on admission that "I have been using cocaine and alcohol for many years, the woman I am living with does drugs and this makes me want to do drugs, I need for her to move out." Patient endorses use of alcohol daily. Amount of use each day varies significantly, uses alcohol provided by friend who visit his home. He denies history of delirium tremens, denies history of alcohol-related seizure.  He does not usually use alcohol until blacking out. He uses cocaine, and smokes crack cocaine an average of 4 or more times per week. Most recent cocaine use on 05/12.  He also smokes marijuana daily.  Most recent marijuana use 05/12. He has engaged in residential substance use treatment has been more than 2 years ago.  Patient is committed to his sobriety. Patient endorses history of schizophrenia.  [facility] ACT team Juanna Cao [phone] confirm patient is compliant with long-acting injectable medication Invega Sustenna 100 mg monthly, next dose due on 11/18/2022.  He endorses previous inpatient psychiatric hospitalizations.  Unable to recall most recent inpatient hospitalization.  No family mental health or addiction history reported".   Patient reported to this Clinician on today that he would residential placement once stable for discharge. Patient reports his goal would be to go to a long term care program for 60 days to a year if able. Patient reports he does not believe he will stay clean if he returns back home at this time. Patient aware that there may be barriers to placement due to LAI and insurance, however LCSW will make an attempt to locate placement if able. Patient expressed understanding and appreciation for LCSW assistance. Patient provided permission for  LCSW to follow up with his ACT team Representative Juanna Cao to provide update. Patient aware that LCSW will send referrals out for review and will follow up to provide updates as received.   LCSW attempted to contact Juanna Cao [phone], however received no answer. LCSW left non emergency voice message at 3:40pm requesting phone call back.   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 [employer] [place]: no psychotropic meds allowed [employer]: only for ages 21-50 no older than that; must be able to do physical work; [employer]: must be willing to work [employer]: work based; no psychotropic meds allowed [employer] in [place], [place]: work program; no antipsychotic meds; [facility] in [place], [place]: from nearest hospital so cannot accommodate certain patients; must be fully independent Living [employer] in [place], [place]: does not accept psych meds; 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 [employer] in [place], [place]: work based; at least 2 weeks of inpatient treatment required prior to consideration of placement [facility]: [place] and [employer] only; someone with a walker would not be qualify for housing [employer]: work based [employer]: must be able to work 40 hours a week [employer]: must be able to work 40 hours a week [facility]: work based [facility]: insurance not accepted  [employer] Referral in [place]: required to walk to meetings which could be up to  a mile away from home [employer]: not medical facility; no MD's on site; must be able to work [employer]: will send referral for review [employer]: insurance not accepted [facility]: will send referral for review   LCSW will  provide updates as received.   Fernande Boyden, LCSW Clinical Social Worker [facility] Ph: [phone]

## 2022-11-01 NOTE — ED Notes (Signed)
Patient alert and oriented x 3. Denies SI/HI/AVH. Denies intent or plan to harm self or others. Routine conducted according to faculty protocol. Encourage patient to notify staff with any needs or concerns. Patient verbalized agreement and understanding. Will continue to monitor for safety. 

## 2022-11-01 NOTE — Group Note (Signed)
Group Topic: Overcoming Obstacles  Group Date: 11/01/2022 Start Time: 0800 End Time: 0830 Facilitators: Emmit Pomfret D, NT  Department: [facility]  Number of Participants: 5  Group Focus: daily focus Treatment Modality:  Psychoeducation Interventions utilized were story telling Purpose: enhance coping skills, express feelings, increase insight, and regain self-worth  Name: Rijul Ammar Date of Birth: 1960-09-15  MR: [id number]    Level of Participation: moderate Quality of Participation: attentive Interactions with others: gave feedback Mood/Affect: appropriate Triggers (if applicable): n/a Cognition: concrete Progress: Significant Response: n/a Plan: follow-up needed  Patients Problems:  Patient Active Problem List   Diagnosis Date Noted   Alcohol use disorder 10/31/2022   Chronic hepatitis C without hepatic coma (HCC)    Osteoporosis

## 2022-11-01 NOTE — Group Note (Signed)
Group Topic: Trust and Honesty  Group Date: 11/01/2022 Start Time: 1010 End Time: 1145 Facilitators: Vonzell Schlatter B  Department: [facility]  Number of Participants: 8  Group Focus: activities of daily living skills Treatment Modality:  Psychoeducation Interventions utilized were patient education Purpose: relapse prevention strategies  Name: Reginald Martin Date of Birth: 02-Oct-1960  MR: [id number]    Level of Participation: active Quality of Participation: attentive and cooperative Interactions with others: gave feedback Mood/Affect: positive Triggers (if applicable): na Cognition: coherent/clear Progress: Moderate Response: na Plan: follow-up needed  Patients Problems:  Patient Active Problem List   Diagnosis Date Noted   Alcohol use disorder 10/31/2022   Chronic hepatitis C without hepatic coma (HCC)    Osteoporosis

## 2022-11-01 NOTE — ED Notes (Signed)
STAT lab courier called to transport labs to [facility] lab 

## 2022-11-01 NOTE — ED Notes (Signed)
Patient  resting in no acute stress. RR even and unlabored .Environment secured .Will continue to monitor for safely. 

## 2022-11-01 NOTE — Group Note (Signed)
Group Topic: Communication  Group Date: 11/01/2022 Start Time: 1130 End Time: 1200 Facilitators: Merrie Roof, RN  Department: [facility]  Number of Participants: 5  Group Focus: safety plan Treatment Modality:  Individual Therapy Interventions utilized were assignment Purpose: express feelings  Name: Careem Grew Date of Birth: 10-Jul-1960  MR: [id number]    Level of Participation: active Quality of Participation: attentive Interactions with others: gave feedback Mood/Affect: appropriate Triggers (if applicable):  Cognition: coherent/clear Progress: Significant Response:  Plan: patient will be encouraged to continue therapy  Patients Problems:  Patient Active Problem List   Diagnosis Date Noted   Alcohol use disorder 10/31/2022   Chronic hepatitis C without hepatic coma (HCC)    Osteoporosis

## 2022-11-01 NOTE — ED Notes (Signed)
Pt sleeping@this time. Breathing even and unlabored. Will continue to monitor for safety 

## 2022-11-01 NOTE — ED Provider Notes (Addendum)
[facility] Progress Note  Date: 11/01/22 Patient Name: Reginald Martin MRN: [id number] Chief Complaint:  Chief Complaint  Patient presents with   Addiction Problem      Diagnosis:  Final diagnoses:  Alcohol use disorder  Cocaine use disorder (HCC)  Marijuana use   Subjective: Patient says he slept well last night.  Endorses fair appetite.  When asked why he is limping and using a walker, patient says, "because I have been using drugs." When asked to clarify, he says he is limping because he uses too much drugs. He denies leg or hip pain, just "stiffness." He says he had an injury to his left leg in the 1970s, "a torn cartilage," for which he had to get surgery.  Patient says he started drinking at age 22, but did not start heavily drinking until several years ago when he brought his own place.  He says his friends would often come to his house, about 3-4 times a week, beer, liquor, and wine which he would drink with them.  He says he drinks every day but cannot quantify how many drinks he consumes.  Patient says his friends also bring cocaine with them when they go to his house - he uses cocaine 3-4 times a week. He endorses trying heroin and methamphetamines but the last time he took them was 5 months ago. He denies regular consumption of cannabis, but did endorse use prior to admission. He endorses smoking around 2 packs every 3 days that he shares with friends. He denies prior IV drug use.  When asked about his prior psych history including his documented schizophrenia and why he takes divalproex and trihexyphenidyl, patient says "I do not know about that - ask my ACT team."  He corrects me that he actually is seeking placement to residential rehab and was angry, saying "someone's lying," when I said I thought he was not interested.  Tried to call [name] from his ACT team x2 to obtain collateral but no response. Left voicemail.  Total Time spent with patient: 45  minutes   Mental Status Exam  Appearance and Grooming: Patient appears overall clean.  Behavior: The patient appears in no acute distress, and during the interview, was calm, focused, required minimal redirection, and behaving appropriately to scenario. He was able to follow commands and compliant to requests and made good eye contact.  The patient did not appear internally or externally preoccupied.  Attitude: Patient's attitude towards the interviewer and other individuals present was cooperative and irritable, as evidenced by complaining he has repeatedly answered the same questions before .  Motor activity: There was no notable abnormal facial movements and no notable abnormal extremity movements.  Speech: The volume of his speech was within an appropriate range and normal in quantity. The rate was appropriately paced with a normal rhythm. Responses were normal in latency. There was no abnormal patterns in speech.  Mood: "I did a lot of drugs"  Affect: Patient's affect is euthymic and irritable at times with broad range and even fluctuations. His affect is appropriate for the topic of conversation. ------------------------------------------------------------------------------------------------------------------------- Perception The patient describes no hallucinations.  Thought Content The patient describes no delusional thoughts.  Patient at the time of interview denies passive or active suicidal ideations. He denies homicidal intent.  Thought Process The patient's thought process is linear and is goal-directed.  Insight The patient at the time of interview demonstrates poor insight, as evidenced by acknowledgement of substance use disorder/s, lacking understanding of mental  health condition/s, inability to identify trigger/s causing mental health decompensation, and inability to identify adaptive and maladaptive coping strategies.  Judgement The patient over the past 24  hours demonstrates fair judgement, as evidenced by help-seeking behavior, such as voluntary admission to mental health facility, requesting outpatient resources, and seeking placement for rehab, openness to starting psychotropic medications and adhering to psychotropic medication regimen, and engaging appropriately with staff / other patients.  ROS and Physical Exam  Review of Systems  Constitutional: Negative.   Respiratory: Negative.    Cardiovascular: Negative.   Gastrointestinal: Negative.   Genitourinary: Negative.   MSK: patient has notable limp and is using a walker, horizontal scar present on anterior knee  Blood pressure 133/89, pulse 64, temperature (!) 97.5 F (36.4 C), resp. rate 18, SpO2 99 %. There is no height or weight on file to calculate BMI. Physical Exam Vitals and nursing note reviewed.  Constitutional:      Appearance: Normal appearance.  HENT:     Head: Normocephalic and atraumatic.  Pulmonary:     Effort: Pulmonary effort is normal.  Neurological:     General: No focal deficit present.     Mental Status: He is alert. Mental status is at baseline.    Assets  Assets:Communication Skills; Desire for Improvement; Financial Resources/Insurance; Housing; Physical Health; Resilience; Social Support  Past Medical History:  Past Medical History:  Diagnosis Date   Hepatitis C    Hypertension    Osteoporosis    No past surgical history on file.  Family History:  No family history on file.  Social History:  Social History   Socioeconomic History   Marital status: Single    Spouse name: Not on file   Number of children: Not on file   Years of education: Not on file   Highest education level: Not on file  Occupational History   Not on file  Tobacco Use   Smoking status: Every Day    Packs/day: .25    Types: Cigarettes   Smokeless tobacco: Never   Tobacco comments:    trying to cut back  Substance and Sexual Activity   Alcohol use: No     Alcohol/week: 0.0 standard drinks of alcohol   Drug use: No   Sexual activity: Not on file  Other Topics Concern   Not on file  Social History Narrative   Not on file   Social Determinants of Health   Financial Resource Strain: Not on file  Food Insecurity: Not on file  Transportation Needs: Not on file  Physical Activity: Not on file  Stress: Not on file  Social Connections: Not on file  Intimate Partner Violence: Not on file    SDOH:  SDOH Screenings   Depression (PHQ2-9): Low Risk  (10/31/2022)  Tobacco Use: High Risk (10/31/2022)    ASSESSMENT  Hoa Fraim is a 62 y.o. man with past psychiatric history of schizophrenia and past substance use history of alcohol use disorder and cocaine use disorder  Patient continues to benefit [facility] due to risk of medical instability due to the effects of substance intoxication and risk of medical instability due to the effects of substance withdrawal.  I will need to speak his ACT team to understand why he is prescribed divalproex and trihexyphenidyl. Otherwise, plan to continue seeking placement for patient at residential rehab.  Problem List: #Alcohol use disorder Patient endorses last drink was prior to admission. Reports daily alcohol use for many years. He  endorses mild cravings at this time. - continue CIWA protocol with lorazepam taper - can consider naltrexone for cravings if interested  #Stimulant use disorder Tested positive for cocaine, also confirms many years of use. He endorses mild cravings at this time - can consider topiramate for cravings if interested  #Schizophrenia Documented history. Denying AVH at this time. Patient unaware of the details of his diagnosis and refers me to speak to his ACT team to get details - continue scheduled paliperidone LAI  #Antalgic gait Patient has documented history of fall from roof and pain in R knee. He also has a well-healed scar on  anterior L knee. He attributes his gait to drug use. Denies any pain  #Chronic HCV hepatitis Last checked HCV quant in 2016 was not detected. - repeat HCV quantitative  PRN medications for symptomatic management: -- continue acetaminophen 650 mg every 6 hours as needed for mild to moderate pain, fever, and headaches -- continue hydroxyzine 25 mg three times a day as needed for anxiety -- continue bismuth subsalicylate 524 mg oral chewable tablet every 3 hours as needed for diarrhea / loose stools -- continue senna 8.6 mg oral at bedtime and polyethylene glycol 17 g oral daily as needed for mild to moderate constipation -- continue ondansetron 8 mg every 8 hours as needed for nausea or vomiting -- continue aluminum-magnesium hydroxide + simethicone 30 mL every 4 hours as needed for heartburn or indigestion  Long Term Goals: Improvement in symptoms so as ready for discharge  Short Term Goals: Ability to identify changes in lifestyle to reduce recurrence of condition, verbalize feelings, identify and develop effective coping behaviors, maintain clinical measurements within normal limits, and identify triggers associated with substance abuse/mental health issues will improve. Improvement in ability to demonstrate self-control and comply with prescribed medications.  Disposition: awaiting residential rehabilitation placement  I discussed my assessment, planned testing and intervention for the patient with Dr. Enedina Finner who agrees with my formulated course of action.  Signed: Augusto Gamble, MD 11/01/2022, 2:19 PM

## 2022-11-01 NOTE — ED Notes (Signed)
Patient in milieu. Environment is secured. Will continue to monitor for safety. 

## 2022-11-01 NOTE — ED Notes (Signed)
Pt sitting down in chair in dayroom watching television calm and cooperative. No c/o pain or distress. Will continue to monitor for safety

## 2022-11-02 DIAGNOSIS — F1290 Cannabis use, unspecified, uncomplicated: Secondary | ICD-10-CM | POA: Diagnosis not present

## 2022-11-02 DIAGNOSIS — F1410 Cocaine abuse, uncomplicated: Secondary | ICD-10-CM | POA: Diagnosis not present

## 2022-11-02 DIAGNOSIS — F1010 Alcohol abuse, uncomplicated: Secondary | ICD-10-CM | POA: Diagnosis not present

## 2022-11-02 LAB — HCV RNA QUANT: HCV Quantitative: NOT DETECTED IU/mL (ref >50)

## 2022-11-02 MED ORDER — LORAZEPAM 1 MG PO TABS
1.0000 mg | ORAL_TABLET | Freq: Once | ORAL | Status: AC
  Administered 2022-11-02: 1 mg via ORAL

## 2022-11-02 NOTE — ED Notes (Signed)
Patient has been awake and alert on unit throughout the day without complaint or distress.  He is calm and cooperative with care.  Patient has been watching TV and socializing with peers.  He has attended all groups held.  He was assisted with changing from diaper to pull up and it has been helpful with his incontinence.  Patient encouraged to seek assistance from staff when he needs the pull up changed. Will monitor and provide a safe environment.

## 2022-11-02 NOTE — Group Note (Signed)
Group Topic: Change and Accountability  Group Date: 11/02/2022 Start Time: 1100 End Time: 1205 Facilitators: Vonzell Schlatter B  Department: [facility]  Number of Participants: 7  Group Focus: daily focus Treatment Modality:  Psychoeducation Interventions utilized were group exercise Purpose: increase insight  Name: [name] Date of Birth: 04/14/1961  MR: [id number]    Level of Participation: active Quality of Participation: attentive and cooperative Interactions with others: gave feedback Mood/Affect: positive Triggers (if applicable): na Cognition: coherent/clear Progress: Moderate Response: na Plan: follow-up needed  Patients Problems:  Patient Active Problem List   Diagnosis Date Noted   Alcohol use disorder 10/31/2022   Chronic hepatitis C without hepatic coma (HCC)    Osteoporosis

## 2022-11-02 NOTE — ED Provider Notes (Signed)
[facility] Progress Note  Date: 11/02/22 Patient Name: Reginald Martin MRN: [id number] Chief Complaint:  Chief Complaint  Patient presents with   Addiction Problem      Diagnosis:  Final diagnoses:  Alcohol use disorder  Cocaine use disorder (HCC)  Marijuana use  Chronic hepatitis C without hepatic coma (HCC)   Subjective: Patient says he slept fine last night. He says he was annoyed because I disturbed him while he was eating breakfast.  He says he urinated on himself this morning because he could not make it to the bathroom (because of his walker) and told me he did not wear a diaper when instructed because he did not know he needed to wear one.  He remains interested in getting placed to residential rehab.  Collateral information obtained from patient's ACT team Patient's ACT team says he takes divalproex for schizoaffective disorder, bipolar type. They said he also takes trihexyphenidyl for EPS.  They said patient has never been incontinent. They are seeking residential rehab placement for him.  Total Time spent with patient: 45 minutes   Mental Status Exam  Appearance and Grooming: Patient appears overall clean.  Behavior: The patient appears in no acute distress, and during the interview, was calm, focused, required minimal redirection, and behaving appropriately to scenario. He was able to follow commands and compliant to requests and made good eye contact.  The patient did not appear internally or externally preoccupied.  Attitude: Patient's attitude towards the interviewer and other individuals present was cooperative and irritable, as evidenced by complaining about me interrupting his meal to ask him questions .  Motor activity: There was no notable abnormal facial movements and no notable abnormal extremity movements.  Speech: The volume of his speech was within an appropriate range and normal in quantity. The rate was appropriately paced  with a normal rhythm. Responses were normal in latency. There was no abnormal patterns in speech.  Mood: "Why'd you have to talk to me while I was eating?"  Affect: Patient's affect is euthymic and irritable at times with broad range and even fluctuations. His affect is appropriate for the topic of conversation. ------------------------------------------------------------------------------------------------------------------------- Perception The patient describes no hallucinations.  Thought Content The patient describes no delusional thoughts.  Patient at the time of interview denies passive or active suicidal ideations. He denies homicidal intent.  Thought Process The patient's thought process is linear and is goal-directed.  Insight The patient at the time of interview demonstrates poor insight, as evidenced by acknowledgement of substance use disorder/s, lacking understanding of mental health condition/s, inability to identify trigger/s causing mental health decompensation, and inability to identify adaptive and maladaptive coping strategies.  Judgement The patient over the past 24 hours demonstrates poor judgement, as evidenced by help-seeking behavior, such as voluntary admission to mental health facility, requesting outpatient resources, and seeking placement for rehab, openness to starting psychotropic medications and adhering to psychotropic medication regimen, and engaging inappropriately with staff / other patients.  ROS and Physical Exam  Review of Systems  Constitutional: Negative.   Respiratory: Negative.    Cardiovascular: Negative.   Gastrointestinal: Negative.   Genitourinary: Negative.   MSK: patient has notable limp and is using a walker  Blood pressure 137/62, pulse 92, temperature (!) 97.5 F (36.4 C), temperature source Oral, resp. rate 18, SpO2 100 %. There is no height or weight on file to calculate BMI. Physical Exam Vitals and nursing note reviewed.   Constitutional:      Appearance: Normal appearance.  HENT:     Head: Normocephalic and atraumatic.  Pulmonary:     Effort: Pulmonary effort is normal.  Neurological:     General: No focal deficit present.     Mental Status: He is alert. Mental status is at baseline.    Assets  Assets:Communication Skills; Desire for Improvement; Financial Resources/Insurance; Housing; Physical Health; Resilience; Social Support  Past Medical History:  Past Medical History:  Diagnosis Date   Hepatitis C    Hypertension    Osteoporosis    No past surgical history on file.  Family History:  No family history on file.  Social History:  Social History   Socioeconomic History   Marital status: Single    Spouse name: Not on file   Number of children: Not on file   Years of education: Not on file   Highest education level: Not on file  Occupational History   Not on file  Tobacco Use   Smoking status: Every Day    Packs/day: .25    Types: Cigarettes   Smokeless tobacco: Never   Tobacco comments:    trying to cut back  Substance and Sexual Activity   Alcohol use: No    Alcohol/week: 0.0 standard drinks of alcohol   Drug use: No   Sexual activity: Not on file  Other Topics Concern   Not on file  Social History Narrative   Not on file   Social Determinants of Health   Financial Resource Strain: Not on file  Food Insecurity: Not on file  Transportation Needs: Not on file  Physical Activity: Not on file  Stress: Not on file  Social Connections: Not on file  Intimate Partner Violence: Not on file    SDOH:  SDOH Screenings   Depression (PHQ2-9): Low Risk  (10/31/2022)  Tobacco Use: High Risk (10/31/2022)    ASSESSMENT  Reginald Martin is a 62 y.o. man with past psychiatric history of schizophrenia and past substance use history of alcohol use disorder and cocaine use disorder  Patient continues to benefit [facility] due to risk of medical  instability due to the effects of substance intoxication and risk of medical instability due to the effects of substance withdrawal.  Given patient's diagnosis of schizoaffective disorder on a long-acting injectable, enrollment in ACT team, and recent incontinence, he is unlikely to find placement for residential rehab. Will monitor patient for one more day as there is concern for delirium secondary to receiving lorazepam, then plan to discharge back to care of ACT team tomorrow.  Problem List: #Alcohol use disorder Patient endorses last drink was prior to admission. Reports daily alcohol use for many years. He endorses mild cravings at this time. - continue CIWA protocol  - stop lorazepam taper - can consider naltrexone for cravings if interested  #Stimulant use disorder Tested positive for cocaine, also confirms many years of use. He endorses mild cravings at this time - can consider topiramate for cravings if interested  #Schizoaffective disorder Obtained history from patient's ACT team. Denying AVH at this time. Patient unaware of the details of his diagnosis and refers me to speak to his ACT team to get details about his diagnosis - continue scheduled paliperidone LAI - continue divalproex 500 mg BID  - continue trihexyphenidyl 2 mg BID for EPS symptoms  #Antalgic gait Patient has documented history of fall from roof and pain in R knee. He also has a well-healed scar on anterior L knee. He attributes his gait  to drug use.  #Chronic HCV hepatitis Last checked HCV quant in 2016 was not detected. - repeat HCV quantitative  #Urinary incontinence Patient urinated on himself and needed diaper placed. Urinalysis obtained. - f/u urinalysis  PRN medications for symptomatic management: -- continue acetaminophen 650 mg every 6 hours as needed for mild to moderate pain, fever, and headaches -- continue hydroxyzine 25 mg three times a day as needed for anxiety -- continue bismuth  subsalicylate 524 mg oral chewable tablet every 3 hours as needed for diarrhea / loose stools -- continue senna 8.6 mg oral at bedtime and polyethylene glycol 17 g oral daily as needed for mild to moderate constipation -- continue ondansetron 8 mg every 8 hours as needed for nausea or vomiting -- continue aluminum-magnesium hydroxide + simethicone 30 mL every 4 hours as needed for heartburn or indigestion  Long Term Goals: Improvement in symptoms so as ready for discharge  Short Term Goals: Ability to identify changes in lifestyle to reduce recurrence of condition, verbalize feelings, identify and develop effective coping behaviors, maintain clinical measurements within normal limits, and identify triggers associated with substance abuse/mental health issues will improve. Improvement in ability to demonstrate self-control and comply with prescribed medications.  Disposition: awaiting residential rehabilitation placement  I discussed my assessment, planned testing and intervention for the patient with Dr. Enedina Finner who agrees with my formulated course of action.  Signed: Augusto Gamble, MD 11/02/2022, 9:05 AM

## 2022-11-02 NOTE — Group Note (Signed)
Group Topic: Wellness  Group Date: 11/02/2022 Start Time: 1630 End Time: 1700 Facilitators: Jenean Lindau, RN  Department: [facility]  Number of Participants: 6  Group Focus: nursing group Treatment Modality:  Solution-Focused Therapy Interventions utilized were group exercise Purpose: enhance coping skills, express feelings, increase insight, and reinforce self-care  Name: Reginald Martin Date of Birth: 07/14/1960  MR: [id number]    Level of Participation: minimal Quality of Participation: quiet Interactions with others: gave feedback Mood/Affect: appropriate Triggers (if applicable):   Cognition: concrete Progress: Minimal Response:   Plan: follow-up needed  Patients Problems:  Patient Active Problem List   Diagnosis Date Noted   Alcohol use disorder 10/31/2022   Chronic hepatitis C without hepatic coma (HCC)    Osteoporosis

## 2022-11-02 NOTE — Plan of Care (Addendum)
Notified by RN that patient wanted to speak with provider. Patient expressed frustration regarding his urinary incontinence as he has never experienced this before in the past and is the primary reason he was denied from [facility]. He states they knew about his incontinence and blamed the provider that prescribed the lorazepam. He reports feeling like a "[place] pig that was being experimented on". I discussed the indications for lorazepam and why the medication was important to manage alcohol withdrawal symptoms.    I apologized to him that this had become a barrier to his acceptance to [facility].  I did report that we have discontinued the lorazepam so he should no longer experience urinary incontinence. Per RN, he has not had any urinary incontinence since he woke up this morning. He accepted the apology and denies any other acute concerns. He asked that we reach out to [facility] tomorrow to see if they would accept him if he is no longer experiencing urinary incontinence.  He is currently being considered at [facility] and [facility]. Provider and LCSW tomorrow will follow up on discharge planning.  Park Pope, MD PGY2 Psychiatry Resident

## 2022-11-02 NOTE — Discharge Planning (Signed)
LCSW followed up with [facility] to inquire if referral was received. Per Elease Hashimoto, referral has been received and patient is advised to call in to complete phone screening. Number provided to patient for his follow up. LCSW observed patient walk to the phone to complete phone screening. Awaiting update from [facility].   LCSW followed up with [facility] [phone] regarding referral and per Chanetta Marshall, referral was received. Benefits verified and patient has been accepted for admission on Friday 11/04/2022 by 9:00am. Update has been provided to the MD. Patient will be informed at a later time. Patient will need a 14-30 day supply of medication and one month refill. No nicotine gum allowed, however 14-30 day nicotine patches to be provided if needed. LCSW will call to inform ACT team once plan has been solidified on all ends. No other needs to report at this time.    Fernande Boyden, LCSW Clinical Social Worker [facility] Ph: [phone]

## 2022-11-02 NOTE — ED Notes (Addendum)
Patient observed/assessed at nursing station. Patient alert and oriented x 4. Affect is flat. Patient denies pain and anxiety. He denies A/V/H. He denies having any thoughts/plan of self harm and harm towards others. Fluid and snack offered. Patient states that appetite has been good throughout the day. Verbalizes no further complaints at this time. Will continue to monitor and support.  

## 2022-11-02 NOTE — Group Note (Signed)
Group Topic: Balance in Life  Group Date: 11/02/2022 Start Time: 0130 End Time: 0230 Facilitators: Lenny Pastel  Department: [facility]  Number of Participants: 5  Group Focus: chemical dependency issues, clarity of thought, communication, coping skills, impulsivity, personal responsibility, problem solving, relapse prevention, self-awareness, self-esteem, and substance abuse education Treatment Modality:  Behavior Modification Therapy and Psychoeducation Interventions utilized were assignment, exploration, group exercise, problem solving, and support Purpose: enhance coping skills, explore maladaptive thinking, express feelings, express irrational fears, improve communication skills, increase insight, regain self-worth, reinforce self-care, relapse prevention strategies, and trigger / craving management  Name: Reginald Martin Date of Birth: 1960-09-05  MR: [id number]    Level of Participation: active Quality of Participation: cooperative and redirectable Interactions with others: gave feedback Mood/Affect: brightens with interaction Triggers (if applicable): N/A Cognition: rigid Progress: Minimal Response: Patient actively participated in group on today. Group started off with introductions and group rules. Group members participated in a therapeutic activity that required active listening and communication skills. Group members were able to identify similarities and differences within the group. Patient interacted positively with staff and peers. Patient reports he wants to work on eliminating people that is not good for him out of his life. Patient reports he also wants to find other ways to be wise with his funds instead of using them on substances. No issues to report.  Plan: referral / recommendations  Patients Problems:  Patient Active Problem List   Diagnosis Date Noted   Alcohol use disorder 10/31/2022   Chronic hepatitis C without hepatic  coma (HCC)    Osteoporosis

## 2022-11-02 NOTE — ED Notes (Signed)
Pt sleeping@this time. Breathing even and unlabored. Will continue to monitor for safety 

## 2022-11-02 NOTE — ED Notes (Signed)
Patient was assisted with a shower this morning.  Aided in getting into paper scrubs, a diaper due to ongoing incontinence, toenails were clipped by RN and fresh socks placed on feet.  Patient has periods of confusion with unsteady gait.  Use of walker strongly encouraged.  MD Discontinued ativan due to possible delirium.  Pull ups have been obtained for his use and patient encouraged to ask staff for help with putting them on and with dressing self due high potential for falls.  Patient was grateful for the assistance he received and has been cooperative and pleasant with staff.  Patient also made phone calls to find placement and he was accepted to [facility] treatment facility for admission on Friday.  Will continue to monitor, provide reminders to patient to utilize walker and to ask for help and will maintain safety.

## 2022-11-02 NOTE — Group Note (Signed)
Group Topic: Social Support  Group Date: 11/02/2022 Start Time: 0800 End Time: 0900 Facilitators: Emmit Pomfret D, NT  Department: [facility]  Number of Participants: 5  Group Focus: relapse prevention Treatment Modality:  Psychoeducation Interventions utilized were story telling Purpose: relapse prevention strategies  Name: Jearld Romberg Date of Birth: 16-Sep-1960  MR: [id number]    Level of Participation: moderate Quality of Participation: attentive Interactions with others: gave feedback Mood/Affect: appropriate Triggers (if applicable): n/a Cognition: concrete Progress: Moderate Response: n/a Plan: follow-up needed  Patients Problems:  Patient Active Problem List   Diagnosis Date Noted   Alcohol use disorder 10/31/2022   Chronic hepatitis C without hepatic coma (HCC)    Osteoporosis

## 2022-11-03 DIAGNOSIS — F1290 Cannabis use, unspecified, uncomplicated: Secondary | ICD-10-CM | POA: Diagnosis not present

## 2022-11-03 DIAGNOSIS — F1410 Cocaine abuse, uncomplicated: Secondary | ICD-10-CM | POA: Diagnosis not present

## 2022-11-03 DIAGNOSIS — F1010 Alcohol abuse, uncomplicated: Secondary | ICD-10-CM | POA: Diagnosis not present

## 2022-11-03 LAB — URINALYSIS, ROUTINE W REFLEX MICROSCOPIC
Bilirubin Urine: NEGATIVE
Glucose, UA: NEGATIVE mg/dL
Hgb urine dipstick: NEGATIVE
Ketones, ur: NEGATIVE mg/dL
Leukocytes,Ua: NEGATIVE
Nitrite: NEGATIVE
Protein, ur: NEGATIVE mg/dL
Specific Gravity, Urine: 1.023 (ref 1.005–1.030)
pH: 7 (ref 5.0–8.0)

## 2022-11-03 NOTE — Discharge Planning (Signed)
LCSW followed up with Mrs. Shanice with [facility] Act Team to provide update regarding [facility] admission. Mrs. Shanice aware that placement will be contingent upon no issues with incontinence. Mrs. Shanice expressed understanding. LCSW explored if ACT team would be able to provide transport for patient on tomorrow to [facility] by 11:30am and Mrs. Shanice confirmed that she could. Shanice also reports that she will follow up with her MD on site regarding filling the patient's prescriptions in order for him to have a supply to take with him. She will follow up to provide updates shortly.   Fernande Boyden, LCSW Clinical Social Worker [facility] Ph: [phone]

## 2022-11-03 NOTE — ED Notes (Signed)
Patient observed/assessed in room in bed appearing in no immediate distress resting peacefully. Q15 minute checks continued by MHT and nursing staff. Will continue to monitor and support. 

## 2022-11-03 NOTE — ED Notes (Signed)
Pt had dinner 

## 2022-11-03 NOTE — ED Notes (Signed)
Pt in room, a/o x4. Denies SI/HI/AVH. No c/o withdrawal symptoms. No noted distress. Will continue to monitor for safety

## 2022-11-03 NOTE — ED Notes (Signed)
Pt had breakfast  °

## 2022-11-03 NOTE — ED Provider Notes (Addendum)
[facility] Progress Note  Date: 11/03/22 Patient Name: Reginald Martin MRN: [id number] Chief Complaint:  Chief Complaint  Patient presents with   Addiction Problem      Diagnosis:  Final diagnoses:  Alcohol use disorder  Cocaine use disorder (HCC)  Marijuana use  Chronic hepatitis C without hepatic coma (HCC)   Subjective: Patient says he slept fine last night. He endorsed his mood as "wonderful". He says he is happy now that he is no longer urinating on himself. He reports he ate well last night. He endorses slight cravings for alcohol and cocaine but states they are manageable with his morning coffee.   Patient denies SI/HI/AVH.   He remains interested in getting placed to residential rehab.  Collateral information obtained from patient's ACT team Patient's ACT team says he takes divalproex for schizoaffective disorder, bipolar type. They said he also takes trihexyphenidyl for EPS.  They said patient has never been incontinent. They are seeking residential rehab placement for him.  Total Time spent with patient: 45 minutes   Mental Status Exam  Appearance and Grooming: Patient appears overall clean.  Behavior: The patient appears in no acute distress, and during the interview, was calm, focused, required minimal redirection, and behaving appropriately to scenario. He was able to follow commands and compliant to requests and made good eye contact.  The patient did not appear internally or externally preoccupied.  Attitude: Patient's attitude towards the interviewer and other individuals present was cooperative as evidenced by him answering questions and asking questions calmly.  Motor activity: There was no notable abnormal facial movements and no notable abnormal extremity movements.  Speech: The volume of his speech was within an appropriate range and normal in quantity. The rate was appropriately paced with a normal rhythm. Responses were normal in  latency. There was no abnormal patterns in speech.  Mood: "My mood is wonderful today"  Affect: Patient's affect is euthymic at times with normal range and even fluctuations. His affect is appropriate for the topic of conversation. ------------------------------------------------------------------------------------------------------------------------- Perception The patient describes no hallucinations.  Thought Content The patient describes no delusional thoughts.  Patient at the time of interview denies passive or active suicidal ideations. He denies homicidal intent.  Thought Process The patient's thought process is linear and is goal-directed.  Insight The patient at the time of interview demonstrates poor insight, as evidenced by acknowledgement of substance use disorder/s, lacking understanding of mental health condition/s, inability to identify trigger/s causing mental health decompensation, and inability to identify adaptive and maladaptive coping strategies.  Judgement The patient over the past 24 hours demonstrates poor judgement, as evidenced by help-seeking behavior, such as voluntary admission to mental health facility, requesting outpatient resources, and seeking placement for rehab, openness to starting psychotropic medications and adhering to psychotropic medication regimen, and engaging inappropriately with staff / other patients.  ROS and Physical Exam  Review of Systems  Constitutional: Negative.   Respiratory: Negative.    Cardiovascular: Negative.   Gastrointestinal: Negative.   Genitourinary: Negative.   Neurological: Negative.   Psychiatric/Behavioral: Negative.    All other systems reviewed and are negative. MSK: patient has notable limp and is using a walker  Blood pressure 124/82, pulse 62, temperature 97.9 F (36.6 C), temperature source Oral, resp. rate 17, SpO2 95 %. There is no height or weight on file to calculate BMI. Physical Exam Vitals and  nursing note reviewed.  Constitutional:      Appearance: Normal appearance.  HENT:  Head: Normocephalic and atraumatic.  Pulmonary:     Effort: Pulmonary effort is normal.  Neurological:     General: No focal deficit present.     Mental Status: He is alert. Mental status is at baseline.    ASSESSMENT  Reginald Martin is a 62 y.o. man with past psychiatric history of schizophrenia and past substance use history of alcohol use disorder and cocaine use disorder  Patient continues to benefit from [facility] at [facility] due to risk of medical instability due to the effects of substance intoxication and risk of medical instability due to the effects of substance withdrawal.  Given patient's diagnosis of schizoaffective disorder on a long-acting injectable, enrollment in ACT team, and recent incontinence, he is unlikely to find placement for residential rehab. Will monitor patient for one more day as there is concern for delirium secondary to receiving lorazepam, then plan to discharge back to care of ACT team tomorrow.  Problem List: #Alcohol use disorder Patient endorses last drink was prior to admission. Reports daily alcohol use for many years. He endorses mild cravings at this time.  #Stimulant use disorder Tested positive for cocaine, also confirms many years of use. He endorses mild cravings at this time  #Schizoaffective disorder Obtained history from patient's ACT team. Denying AVH at this time. Patient unaware of the details of his diagnosis and refers me to speak to his ACT team to get details about his diagnosis - continue scheduled paliperidone LAI - continue divalproex 500 mg BID  - continue trihexyphenidyl 2 mg BID for EPS symptoms  #Antalgic gait Patient has documented history of fall from roof and pain in R knee. He also has a well-healed scar on anterior L knee. He attributes his gait to drug use.  #History HCV hepatitis HCV quantitative not  detected  #Urinary incontinence Patient urinated on himself and needed diaper placed. Urinalysis obtained. - f/u urinalysis  PRN medications for symptomatic management: -- continue acetaminophen 650 mg every 6 hours as needed for mild to moderate pain, fever, and headaches -- continue hydroxyzine 25 mg three times a day as needed for anxiety -- continue bismuth subsalicylate 524 mg oral chewable tablet every 3 hours as needed for diarrhea / loose stools -- continue senna 8.6 mg oral at bedtime and polyethylene glycol 17 g oral daily as needed for mild to moderate constipation -- continue ondansetron 8 mg every 8 hours as needed for nausea or vomiting -- continue aluminum-magnesium hydroxide + simethicone 30 mL every 4 hours as needed for heartburn or indigestion  Long Term Goals: Improvement in symptoms so as ready for discharge  Short Term Goals: Ability to identify changes in lifestyle to reduce recurrence of condition, verbalize feelings, identify and develop effective coping behaviors, maintain clinical measurements within normal limits, and identify triggers associated with substance abuse/mental health issues will improve. Improvement in ability to demonstrate self-control and comply with prescribed medications.  Disposition:  plan for discharge to ACT team tomorrow  I discussed my assessment, planned testing and intervention for the patient with Dr. Enedina Finner who agrees with my formulated course of action.  Signed: Anselmo Pickler, Medical Student 11/03/2022, 9:26 AM   Signed: Bary Leriche, MD [facility] Phys[facility] 11/03/2022 10:28 AM

## 2022-11-03 NOTE — Discharge Planning (Signed)
LCSW received update from Admissions Coordinator Kiera at [facility] that patient has been denied due to [facility] Injection and reports the agency will not be able to support him for treatment. Update was provided to the patient and the patient became upset asking to be discharged on today. LCSW attempted to provide brief counseling to the patient, however patient was adamant about wanting to discharge stating "It's pointless to be here".   LCSW and MD spoke with the patient's ACT Team who reports they would not be able to assist the patient with transportation on today as he was not on the list for discharges and would have to wait until tomorrow. LCSW and MD expressed understanding and was advised to call back in the morning regarding transport time.   During meeting, LCSW received phone call from [facility] Admissions Coordinator at [facility] regarding patient's referral. LCSW informed Crystal of situation on yesterday regarding the patient being incontinent, and provided update that patient has been taken off Ativan taper which could have likely contributed to the incontinence. Per Crystal, she is willing to still accept the patient tentatively on tomorrow 05/17 at 11:30am pending whether or not the patient has any bladder issues from now until then Crystal advised LCSW to follow up on tomorrow morning to provide an update. At this time, team will plan for patient to discharge to [facility] on tomorrow by 11:30am. 30 day supply of medication needed for admission. Per MD, ACT team to be notified of this needed by resident. Transportation will be arranged via [employer] if no issues are reported.   LCSW will continue to follow and provide support to patient while on [facility] unit.   Fernande Boyden, LCSW Clinical Social Worker [facility] Ph: [phone]

## 2022-11-03 NOTE — ED Notes (Signed)
Patient denies SI/HI and AVH. Patient ate breakfast and received his medications. Patient has been in dayroom with the other patients. Patient reports he is in a good mood. Patient is being monitored for safety.

## 2022-11-04 DIAGNOSIS — F1010 Alcohol abuse, uncomplicated: Secondary | ICD-10-CM | POA: Diagnosis not present

## 2022-11-04 DIAGNOSIS — F1410 Cocaine abuse, uncomplicated: Secondary | ICD-10-CM | POA: Diagnosis not present

## 2022-11-04 DIAGNOSIS — F1290 Cannabis use, unspecified, uncomplicated: Secondary | ICD-10-CM | POA: Diagnosis not present

## 2022-11-04 MED ORDER — TRIHEXYPHENIDYL HCL 2 MG PO TABS: 2.0000 mg | ORAL_TABLET | Freq: Two times a day (BID) | ORAL | 0 refills | Status: AC

## 2022-11-04 MED ORDER — TRAZODONE HCL 50 MG PO TABS: 50.0000 mg | ORAL_TABLET | Freq: Every evening | ORAL | 0 refills | Status: AC | PRN

## 2022-11-04 MED ORDER — DIVALPROEX SODIUM 500 MG PO DR TAB: 500.0000 mg | DELAYED_RELEASE_TABLET | Freq: Two times a day (BID) | ORAL | 0 refills | Status: AC

## 2022-11-04 NOTE — ED Provider Notes (Cosign Needed Addendum)
[facility] Discharge Summary  Date: 11/04/22 Patient Name: Reginald Martin MRN: [id number] Chief Complaint:  Chief Complaint  Patient presents with   Addiction Problem      Discharge Diagnoses:  Final diagnoses:  Alcohol use disorder  Cocaine use disorder (HCC)  Marijuana use  Chronic hepatitis C without hepatic coma (HCC)   Reginald Martin is a 62 y.o. man with past psychiatric history of schizophrenia and past substance use history of alcohol use disorder and cocaine use disorder   Patient's narrative on day of discharge: Patient says he is ready to go. He denies any side-effects to prescribed medications. He asks if we can inform his ACT team he will need a walker once he leaves for his residential rehabilitation facility.  Stay Summary: #Alcohol use disorder Patient endorses last drink was prior to admission. Reports daily alcohol use for many years. While admitted he endorsed mild cravings but did not exhibit any withdrawal symptoms. He was placed on lorazepam taper initially but this was discontinued because of suspected iatrogenic delirium, urinary incontinence. At time of discharge patient not exhibiting withdrawal symptoms.   #Stimulant use disorder Tested positive for cocaine, also confirms many years of use. He endorses only mild cravings while admitted.   #Schizoaffective disorder Obtained history from patient's ACT team. Patient unaware of the details of his diagnosis and refers me to speak to his ACT team to get details about his diagnosis. Did not demonstrate any mood lability or experience AVH while admitted. Home divalproex and trihexyphenidyl was continued. He also has scheduled paliperidone LAI. These will all be continued at discharge.   #Antalgic gait Patient has documented history of fall from roof and pain in R knee. He also has a well-healed scar on anterior L knee. He attributes his gait to drug use. He was given a walker to use on admission  and continues to do so.   #History HCV hepatitis HCV quantitative not detected   #Urinary incontinence Patient urinated on himself and needed diaper placed. Urinalysis obtained and was negative.  Total Time spent with patient: 45 minutes  Past Psychiatric and Medical History:  Past Medical History:  Diagnosis Date   Hepatitis C    Hypertension    Osteoporosis     No past surgical history on file. Family History:  No family history on file. Social History:  Social History   Substance and Sexual Activity  Alcohol Use No   Alcohol/week: 0.0 standard drinks of alcohol     Social History   Substance and Sexual Activity  Drug Use No    Social History   Socioeconomic History   Marital status: Single    Spouse name: Not on file   Number of children: Not on file   Years of education: Not on file   Highest education level: Not on file  Occupational History   Not on file  Tobacco Use   Smoking status: Every Day    Packs/day: .25    Types: Cigarettes   Smokeless tobacco: Never   Tobacco comments:    trying to cut back  Substance and Sexual Activity   Alcohol use: No    Alcohol/week: 0.0 standard drinks of alcohol   Drug use: No   Sexual activity: Not on file  Other Topics Concern   Not on file  Social History Narrative   Not on file   Social Determinants of Health   Financial Resource Strain: Not on file  Food Insecurity: Not on file  Transportation Needs: Not on file  Physical Activity: Not on file  Stress: Not on file  Social Connections: Not on file   SDOH:  SDOH Screenings   Depression (PHQ2-9): Low Risk  (11/03/2022)  Tobacco Use: High Risk (10/31/2022)    Current Medications:  Current Facility-Administered Medications  Medication Dose Route Frequency Provider Last Rate Last Admin   acetaminophen (TYLENOL) tablet 650 mg  650 mg Oral Q6H PRN Augusto Gamble, MD       And   hydrOXYzine (ATARAX) tablet 25 mg  25 mg Oral TID PRN Augusto Gamble, MD        And   bismuth subsalicylate (PEPTO BISMOL) chewable tablet 524 mg  524 mg Oral Q3H PRN Augusto Gamble, MD       And   senna (SENOKOT) tablet 8.6 mg  1 tablet Oral QHS PRN Augusto Gamble, MD       And   polyethylene glycol (MIRALAX / GLYCOLAX) packet 17 g  17 g Oral Daily PRN Augusto Gamble, MD       And   ondansetron [facility]) tablet 8 mg  8 mg Oral Q8H PRN Augusto Gamble, MD       And   alum & mag hydroxide-simeth (MAALOX/MYLANTA) 200-200-20 MG/5ML suspension 30 mL  30 mL Oral Q4H PRN Augusto Gamble, MD       divalproex (DEPAKOTE) DR tablet 500 mg  500 mg Oral BID Lenard Lance, FNP   500 mg at 11/03/22 2135   multivitamin with minerals tablet 1 tablet  1 tablet Oral Daily Lenard Lance, FNP   1 tablet at 11/03/22 1610   thiamine (VITAMIN B1) tablet 100 mg  100 mg Oral Daily Lenard Lance, FNP   100 mg at 11/03/22 0924   traZODone (DESYREL) tablet 50 mg  50 mg Oral QHS PRN Lenard Lance, FNP   50 mg at 11/03/22 2135   trihexyphenidyl (ARTANE) tablet 2 mg  2 mg Oral BID Lenard Lance, FNP   2 mg at 11/03/22 2135   Current Outpatient Medications  Medication Sig Dispense Refill   INVEGA SUSTENNA 156 MG/ML SUSY injection Inject 156 mg into the muscle every 28 (twenty-eight) days.     divalproex (DEPAKOTE) 500 MG DR tablet Take 1 tablet (500 mg total) by mouth 2 (two) times daily. 60 tablet 0   traZODone (DESYREL) 50 MG tablet Take 1 tablet (50 mg total) by mouth at bedtime as needed for sleep. 30 tablet 0   trihexyphenidyl (ARTANE) 2 MG tablet Take 1 tablet (2 mg total) by mouth 2 (two) times daily. 60 tablet 0    PTA Medications: (Not in a hospital admission)      11/03/2022    7:43 AM 11/02/2022   10:43 AM 10/31/2022   12:42 PM  Depression screen PHQ 2/9  Decreased Interest 0 0 0  Down, Depressed, Hopeless 0 0 0  PHQ - 2 Score 0 0 0  Altered sleeping 0    Tired, decreased energy 0    Change in appetite 0    Feeling bad or failure about yourself  0    Trouble concentrating 0    Moving slowly or  fidgety/restless 0    Suicidal thoughts 0    PHQ-9 Score 0    Difficult doing work/chores Somewhat difficult      Flowsheet Row ED from 10/31/2022 in [facility] ED from 05/15/2021 in [facility] Emergency Department at [facility]  C-SSRS  RISK CATEGORY No Risk No Risk       Mental Status Exam  Appearance and Grooming: Patient appears overall clean.  Behavior: The patient appears in no acute distress, and during the interview, was calm, focused, required minimal redirection, and behaving appropriately to scenario. He was able to follow commands and compliant to requests and made good eye contact.  The patient did not appear internally or externally preoccupied.  Attitude: Patient's attitude towards the interviewer and other individuals present was cooperative and open.  Motor activity: There was no notable abnormal facial movements and no notable abnormal extremity movements.  Speech: The volume of his speech was within an appropriate range and normal in quantity. The rate was appropriately paced with a normal rhythm. Responses were normal in latency. There was no abnormal patterns in speech.  Mood: "Good"  Affect: Patient's affect is euthymic with broad range and even fluctuations. His affect is appropriate for the topic of conversation. ------------------------------------------------------------------------------------------------------------------------- Perception The patient describes no hallucinations.  Thought Content The patient describes no delusional thoughts.  Patient at the time of interview denies passive or active suicidal ideations. He denies homicidal intent.  Thought Process The patient's thought process is linear and is goal-directed.  Insight The patient at the time of interview demonstrates fair insight, as evidenced by acknowledgement of substance use disorder/s, ability to identify trigger/s causing mental  health decompensation, and lacking understanding of mental health condition/s.  Judgement The patient over the past 24 hours demonstrates good judgement, as evidenced by help-seeking behavior, such as voluntary admission to mental health facility and seeking placement for rehab, adhering to psychotropic medication regimen, and engaging appropriately with staff / other patients.  Sleep  Sleep:No data recorded  No data recorded  ROS and Physical Exam  Review of Systems  Constitutional: Negative.   Respiratory: Negative.    Cardiovascular: Negative.   Gastrointestinal: Negative.   Genitourinary: Negative.    Blood pressure 132/89, pulse 71, temperature (!) 97.4 F (36.3 C), temperature source Oral, resp. rate 18, SpO2 100 %. There is no height or weight on file to calculate BMI. Physical Exam Vitals and nursing note reviewed.  Constitutional:      Appearance: Normal appearance.  HENT:     Head: Normocephalic and atraumatic.  Pulmonary:     Effort: Pulmonary effort is normal.  Neurological:     General: No focal deficit present.     Mental Status: He is alert. Mental status is at baseline.     Significant Labs  No results found for: "HGBA1C", "MPG" No results found for: "CHOL", "TRIG", "HDL", "CHOLHDL", "VLDL", "LDLCALC"  Demographic Factors:  Male, Low socioeconomic status, and Unemployed  Loss Factors: Decline in physical health  Historical Factors: NA  Risk Reduction Factors:   Positive coping skills or problem solving skills  Continued Clinical Symptoms:  Alcohol/Substance Abuse/Dependencies More than one psychiatric diagnosis  Cognitive Features That Contribute To Risk:  Closed-mindedness    Suicide Risk:  Mild:  Suicidal ideation of limited frequency, intensity, duration, and specificity.  There are no identifiable plans, no associated intent, mild dysphoria and related symptoms, good self-control (both objective and subjective assessment), few other risk  factors, and identifiable protective factors, including available and accessible social support.  Plan of Care / Follow-up Recommendations  Discharge Medications: Allergies as of 11/04/2022       Reactions   Tylenol [acetaminophen] Other (See Comments)   Due to liver function        Medication List  TAKE these medications    divalproex 500 MG DR tablet Commonly known as: DEPAKOTE Take 1 tablet (500 mg total) by mouth 2 (two) times daily.   Invega Sustenna 156 MG/ML Susy injection Generic drug: paliperidone Inject 156 mg into the muscle every 28 (twenty-eight) days.   traZODone 50 MG tablet Commonly known as: DESYREL Take 1 tablet (50 mg total) by mouth at bedtime as needed for sleep. What changed:  when to take this reasons to take this   trihexyphenidyl 2 MG tablet Commonly known as: ARTANE Take 1 tablet (2 mg total) by mouth 2 (two) times daily.        Assessment and Rationale for Discharge  Patient had found placement at residential rehab facility. His ACT team is aware and are apprised of the discharge plan.  Patient is not suffering from any life-threatening substance intoxication or withdrawal symptoms, is not displaying any concerning psychiatric symptoms that pose a risk of immediate harm to self or others, is denying suicidal or homicidal intent at the time of evaluation, and has no existing legal holds preventing them from leaving the facility. He is ready to transfer to another facility.  Activity: as tolerated Diet: as tolerated Follow-up tests: none  Tobacco Cessation:  N/A, patient does not currently use tobacco products  Disposition:  residential rehab  I discussed my assessment, planned testing and intervention for the patient with Dr. Lucianne Muss who agrees with my formulated course of action.  Signed: Augusto Gamble, MD 11/04/2022, 9:09 AM

## 2022-11-04 NOTE — ED Notes (Signed)
Patient is dayroom. Respirations equal and unlabored, skin warm and dry, NAD. No change in assessment or acuity. Q 15 minute safety checks remain in place.

## 2022-11-04 NOTE — ED Notes (Signed)
Patient resting quietly in bed with eyes closed. Respirations equal and unlabored, skin warm and dry, NAD. No change in assessment or acuity. Q 15 minute safety checks remain in place.   

## 2022-11-04 NOTE — Discharge Planning (Signed)
LCSW received phone call from Admissions Coordinator Crystal regarding updates. Crystal informed that patient did not have any incontinence on last night or yesterday evening. Per Crystal, patient is good to come to their facility on today. Anticipated arrival time is 11:30am and ACT team will transport. ACT team will also provide supply of medications per [facility] [phone]. ACT team plans to pcik up the patient and take him to the office in order to receive his injection prior to admission. No other needs were reported by staff or ACT team. LCSW to sign off at this time. Please inform if further LCSW needs arise prior to discharge. Fernande Boyden, LCSW Clinical Social Worker [facility] Ph: [phone]

## 2022-11-04 NOTE — Discharge Instructions (Signed)
Dear Reginald Martin,  It was a pleasure to take care of you during [facility] stay at [facility] where you were treated for your Alcohol use disorder.  While you were here, you were:  observed and cared for by our nurses and nursing assistants  treated with medications by your psychiatrists  provided individual and group therapy by therapists  provided resources by our social workers and case managers  Please review the medication list provided to you at discharge and stop, start taking, or continue taking the medications listed there.  You should also follow-up with your primary care doctor, or start seeing one if you don't have one yet. If applicable, here are some scheduled follow-ups for you:    I recommend abstinence from alcohol, tobacco, and other illicit drug use.   If your psychiatric symptoms or suicidal thoughts recur, worsen, or if you have side effects to your psychiatric medications, call your outpatient psychiatric provider, 911, [phone] or go to the nearest emergency department.  Take care!  Signed: Augusto Gamble, MD 11/04/2022, 8:14 AM  Naloxone (Narcan) can help reverse an overdose when given to the victim quickly.  [facility] offers free naloxone kits and instructions/training on its use.  Add naloxone to your first aid kit and you can help save a life. A prescription can be filled at your local pharmacy or free kits are provided by the county.  Pick up your free kit at the following locations:   :  [facility], [address] [place] [address] [phone]) [facility] [address] [place] [address] [phone]) [facility] [address] [place] [address]  [place]: [facility] [address] [place] [address] ([phone]) [facility] [address] [place] [address]  [phone])

## 2022-11-04 NOTE — ED Notes (Signed)
Pt observed/assessed in room sleeping. RR even and unlabored, appearing in no noted distress. Environmental check complete, will continue to monitor for safety 

## 2022-11-04 NOTE — ED Notes (Signed)
Patient awkae and alert on unit.  Patient is calm and cooperative with care.  He is pleasant and organized with minimal insight into substance use issues.  He is ambulating using the walker provided for him.  He continues to wear pull ups however they are in case patient cannot get to the bathroom in time.  Otherwise he is toileting himself.  Patient is pending discharge later today and will be picked up by his ACTT team and transported to [facility].  Will monitor until that time.

## 2022-11-08 ENCOUNTER — Telehealth (HOSPITAL_COMMUNITY): Payer: Self-pay | Admitting: Licensed Clinical Social Worker

## 2022-11-08 NOTE — Telephone Encounter (Signed)
The therapist leaves a voicemail for Reginald Martin at [facility] returning her call as she called outpatient at the [facility] wanting to set-up substance abuse counseling for this patient's aftercare.  The therapist reviews this client's chart and he receives [facility].  Myrna Blazer, MA, LCSW, [facility], LCAS 11/08/2022

## 2022-11-09 ENCOUNTER — Telehealth (HOSPITAL_COMMUNITY): Payer: Self-pay | Admitting: Licensed Clinical Social Worker

## 2022-11-09 NOTE — Telephone Encounter (Signed)
The therapist receives a call from [place] with [facility] who provides three identifiers for this patient wanting to set up an outpatient appointment for this patient apparently unaware that he is connected with an ACT Team.  She will fax a ROI after which the therapist will call her back.  [address] MA, LCSW, [facility], LCAS 11/09/2022

## 2022-11-28 ENCOUNTER — Emergency Department (HOSPITAL_BASED_OUTPATIENT_CLINIC_OR_DEPARTMENT_OTHER): Payer: Medicaid Other

## 2022-11-28 ENCOUNTER — Other Ambulatory Visit: Payer: Self-pay

## 2022-11-28 ENCOUNTER — Emergency Department (HOSPITAL_BASED_OUTPATIENT_CLINIC_OR_DEPARTMENT_OTHER)
Admission: EM | Admit: 2022-11-28 | Discharge: 2022-11-28 | Disposition: A | Discharge status: Home / Self Care | Payer: Medicaid Other | Attending: Emergency Medicine | Admitting: Emergency Medicine

## 2022-11-28 ENCOUNTER — Encounter (HOSPITAL_BASED_OUTPATIENT_CLINIC_OR_DEPARTMENT_OTHER): Payer: Self-pay | Admitting: Emergency Medicine

## 2022-11-28 DIAGNOSIS — M7989 Other specified soft tissue disorders: Secondary | ICD-10-CM | POA: Insufficient documentation

## 2022-11-28 DIAGNOSIS — I10 Essential (primary) hypertension: Secondary | ICD-10-CM | POA: Diagnosis not present

## 2022-11-28 LAB — COMPREHENSIVE METABOLIC PANEL
ALT: 14 U/L (ref 0–44)
AST: 21 U/L (ref 15–41)
Albumin: 3.8 g/dL (ref 3.5–5.0)
Alkaline Phosphatase: 61 U/L (ref 38–126)
Anion gap: 8 (ref 5–15)
BUN: 15 mg/dL (ref 8–23)
CO2: 28 mmol/L (ref 22–32)
Calcium: 8.5 mg/dL — ABNORMAL LOW (ref 8.9–10.3)
Chloride: 103 mmol/L (ref 98–111)
Creatinine, Ser: 1.03 mg/dL (ref 0.61–1.24)
GFR, Estimated: >60 mL/min (ref >60)
Glucose, Bld: 92 mg/dL (ref 70–99)
Potassium: 3.7 mmol/L (ref 3.5–5.1)
Sodium: 139 mmol/L (ref 135–145)
Total Bilirubin: 0.3 mg/dL (ref 0.3–1.2)
Total Protein: 7.2 g/dL (ref 6.5–8.1)

## 2022-11-28 LAB — CBC WITH DIFFERENTIAL/PLATELET
Abs Immature Granulocytes: 0.02 10*3/uL (ref 0.00–0.07)
Basophils Absolute: 0 10*3/uL (ref 0.0–0.1)
Basophils Relative: 1 %
Eosinophils Absolute: 0.1 10*3/uL (ref 0.0–0.5)
Eosinophils Relative: 2 %
HCT: 36.7 % — ABNORMAL LOW (ref 39.0–52.0)
Hemoglobin: 12.1 g/dL — ABNORMAL LOW (ref 13.0–17.0)
Immature Granulocytes: 0 %
Lymphocytes Relative: 33 %
Lymphs Abs: 1.9 10*3/uL (ref 0.7–4.0)
MCH: 29.1 pg (ref 26.0–34.0)
MCHC: 33 g/dL (ref 30.0–36.0)
MCV: 88.2 fL (ref 80.0–100.0)
Monocytes Absolute: 0.6 10*3/uL (ref 0.1–1.0)
Monocytes Relative: 11 %
Neutro Abs: 2.9 10*3/uL (ref 1.7–7.7)
Neutrophils Relative %: 53 %
Platelets: 243 10*3/uL (ref 150–400)
RBC: 4.16 MIL/uL — ABNORMAL LOW (ref 4.22–5.81)
RDW: 15.9 % — ABNORMAL HIGH (ref 11.5–15.5)
WBC: 5.6 10*3/uL (ref 4.0–10.5)
nRBC: 0 % (ref 0.0–0.2)

## 2022-11-28 LAB — URINALYSIS, ROUTINE W REFLEX MICROSCOPIC
Bilirubin Urine: NEGATIVE
Glucose, UA: NEGATIVE mg/dL
Hgb urine dipstick: NEGATIVE
Ketones, ur: NEGATIVE mg/dL
Leukocytes,Ua: NEGATIVE
Nitrite: NEGATIVE
Protein, ur: NEGATIVE mg/dL
Specific Gravity, Urine: 1.025 (ref 1.005–1.030)
pH: 7 (ref 5.0–8.0)

## 2022-11-28 LAB — BRAIN NATRIURETIC PEPTIDE: B Natriuretic Peptide: 24.8 pg/mL (ref 0.0–100.0)

## 2022-11-28 LAB — LIPASE, BLOOD: Lipase: 37 U/L (ref 11–51)

## 2022-11-28 LAB — TROPONIN I (HIGH SENSITIVITY): Troponin I (High Sensitivity): 4 ng/L (ref <18)

## 2022-11-28 NOTE — Discharge Instructions (Addendum)
Please increase your hydrochlorothiazide from 12.5 mg to 25 mg daily. I recommend close follow-up with PCP for reevaluation. Please do not hesitate to return to emergency department if worrisome signs symptoms we discussed become apparent.

## 2022-11-28 NOTE — ED Notes (Signed)
Pt refused to have discharge Vitals. Pt requested to leave and have ride called.

## 2022-11-28 NOTE — ED Provider Notes (Signed)
[place] EMERGENCY DE[facility] AT [facility] Provider Note   CSN: [id number] Arrival date & time: 11/28/22  1447     History  Chief Complaint  Patient presents with   Leg Swelling    Reginald Martin is a 62 y.o. male with a past medical history of hypertension, hepatitis presents today for evaluation of leg swelling.  He reports that he has been having bilateral leg swelling since mid May.  He was prescribed hydrochlorothiazide 12.5 mg and he has been taking that once a day.  Patient is upset because the medication make him urinate more frequently and over himself.  He states that the leg swelling has not improved.  He denies any chest pain, shortness of breath, fever, nausea, vomiting, bowel change, urinary symptoms.  He denies any personal or family history of PE or DVT.  HPI    Past Medical History:  Diagnosis Date   Hepatitis C    Hypertension    Osteoporosis    History reviewed. No pertinent surgical history.   Home Medications Prior to Admission medications   Medication Sig Start Date End Date Taking? Authorizing Provider  divalproex (DEPAKOTE) 500 MG DR tablet Take 1 tablet (500 mg total) by mouth 2 (two) times daily. 11/04/22 12/04/22  Augusto Gamble, MD  INVEGA SUSTENNA 156 MG/ML SUSY injection Inject 156 mg into the muscle every 28 (twenty-eight) days. 09/13/22   [provider]  traZODone (DESYREL) 50 MG tablet Take 1 tablet (50 mg total) by mouth at bedtime as needed for sleep. 11/04/22 12/04/22  Augusto Gamble, MD  trihexyphenidyl (ARTANE) 2 MG tablet Take 1 tablet (2 mg total) by mouth 2 (two) times daily. 11/04/22 12/04/22  Augusto Gamble, MD      Allergies    Tylenol [acetaminophen]    Review of Systems   Review of Systems Negative except as per HPI.  Physical Exam Updated Vital Signs BP (!) 151/95 (BP Location: Left Arm)   Pulse 74   Temp 98 F (36.7 C)   Resp 20   Ht 6\' 2"  (1.88 m)   Wt 107.5 kg   SpO2 98%   BMI 30.42 kg/m  Physical  Exam Vitals and nursing note reviewed.  Constitutional:      Appearance: Normal appearance.  HENT:     Head: Normocephalic and atraumatic.     Mouth/Throat:     Mouth: Mucous membranes are moist.  Eyes:     General: No scleral icterus. Cardiovascular:     Rate and Rhythm: Normal rate and regular rhythm.     Pulses: Normal pulses.     Heart sounds: Normal heart sounds.  Pulmonary:     Effort: Pulmonary effort is normal.     Breath sounds: Normal breath sounds.  Abdominal:     General: Abdomen is flat.     Palpations: Abdomen is soft.     Tenderness: There is no abdominal tenderness.  Musculoskeletal:        General: No deformity.     Comments: 1+/2 non pitting edema to bilateral lower extremities.  Skin:    General: Skin is warm.     Findings: No rash.  Neurological:     General: No focal deficit present.     Mental Status: He is alert.  Psychiatric:        Mood and Affect: Mood normal.     ED Results / Procedures / Treatments   Labs (all labs ordered are listed, but only abnormal results are displayed) Labs  Reviewed  CBC WITH DIFFERENTIAL/PLATELET - Abnormal; Notable for the following components:      Result Value   RBC 4.16 (*)    Hemoglobin 12.1 (*)    HCT 36.7 (*)    RDW 15.9 (*)    All other components within normal limits  COMPREHENSIVE METABOLIC PANEL - Abnormal; Notable for the following components:   Calcium 8.5 (*)    All other components within normal limits  LIPASE, BLOOD  URINALYSIS, ROUTINE W REFLEX MICROSCOPIC  BRAIN NATRIURETIC PEPTIDE  TROPONIN I (HIGH SENSITIVITY)    EKG None  Radiology No results found.  Procedures Procedures    Medications Ordered in ED Medications - No data to display  ED Course/ Medical Decision Making/ A&P                             Medical Decision Making Amount and/or Complexity of Data Reviewed Labs: ordered. Radiology: ordered.   This patient presents to the ED for leg swelling, this involves an  extensive number of treatment options, and is a complaint that carries with a high risk of complications and morbidity.  The differential diagnosis includes CHF, AKI, DVT, cellulitis, renal failure, liver failure, myxedema.  This is not an exhaustive list.  Lab tests: I ordered and personally interpreted labs.  The pertinent results include: WBC unremarkable. Hbg unremarkable. Platelets unremarkable. Electrolytes unremarkable. BUN, creatinine unremarkable.  BNP, troponin, lipase within normal limits.  Imaging studies: I ordered imaging studies. I personally reviewed, interpreted imaging and agree with the radiologist's interpretations. The results include: Chest x-ray showed no acute cardiopulmonary process.  Problem list/ ED course/ Critical interventions/ Medical management: HPI: See above Vital signs within normal range and stable throughout visit. Laboratory/imaging studies significant for: See above. On physical examination, patient is afebrile and appears in no acute distress.  There was 1+/2 nonpitting edema on bilateral lower extremities.  Unlikely heart failure, BNP is within normal limits today.  Unlikely ACS/MI, patient has no chest pain, EKG without ischemic changes, troponin negative.  Based on chest x-ray I have low suspicion for pneumonia, pneumothorax.  Recently patient was started on hydrochlorothiazide 12.5 mg daily which he reports increased urination but the swelling has not improved.  He denies any side effects of the medication. He complains that the medication has made him urinate more frequently and wet himself.  Given worsening of leg swelling, advised patient to increase his hydrochlorothiazide 12.5 mg to 25 mg daily.  Patient is agreeable to the plan.  Advised patient to follow-up with his primary care physician for further evaluation and management.  Strict return precaution discussed.  Patient is stable for discharge. I have reviewed the patient home medicines and have made  adjustments as needed.  Cardiac monitoring/EKG: The patient was maintained on a cardiac monitor.  I personally reviewed and interpreted the cardiac monitor which showed an underlying rhythm of: sinus rhythm.  Additional history obtained: External records from outside source obtained and reviewed including: Chart review including previous notes, labs, imaging.  Consultations obtained:  Disposition Continued outpatient therapy. Follow-up with PCP recommended for reevaluation of symptoms. Treatment plan discussed with patient.  Pt acknowledged understanding was agreeable to the plan. Worrisome signs and symptoms were discussed with patient, and patient acknowledged understanding to return to the ED if they noticed these signs and symptoms. Patient was stable upon discharge.   This chart was dictated using voice recognition software.  Despite best efforts to proofread,  errors can occur which can change the documentation meaning.          Final Clinical Impression(s) / ED Diagnoses Final diagnoses:  Leg swelling    Rx / DC Orders ED Discharge Orders     None         Jeanelle Malling, [place] 11/28/22 2204    Virgina Norfolk, DO 11/28/22 2252

## 2022-11-28 NOTE — ED Notes (Signed)
Called [facility] and notified pt ride that pt is being discharged.

## 2022-11-28 NOTE — ED Provider Triage Note (Signed)
Emergency Medicine Provider Triage Evaluation Note  Jesselee Poth , a 62 y.o. male  was evaluated in triage.  Pt complains of bilateral lower extremity edema.  Recently went to behavioral health urgent care, was started on HCTZ for swelling in his legs, sent to [facility] for detox.  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  Is frustrated that he is peeing all over himself which she attributes to his new fluid pill.  He denies shortness of breath, personal or family history of PE or DVT.  Review of Systems  Positive: Leg swelling Negative: Chest pain, shortness of breath  Physical Exam  BP (!) 151/95 (BP Location: Left Arm)   Pulse 74   Temp 98 F (36.7 C)   Resp 20   SpO2 98%  Gen:   Awake, no distress   Resp:  Normal effort  MSK:   Moves extremities without difficulty  Other:  Pitting edema to ankles, calves are soft, nontender, no palpable cords.  Medical Decision Making  Medically screening exam initiated at 3:12 PM.  Appropriate orders placed.  Jaskaran Dauzat was informed that the remainder of the evaluation will be completed by another provider, this initial triage assessment does not replace that evaluation, and the importance of remaining in the ED until their evaluation is complete.     Jeannie Fend, PA-C 11/28/22 1513

## 2022-11-28 NOTE — ED Triage Notes (Signed)
Pt is currently in [facility] days.  Pt states he has been swelling for about one month.  Pt states he has some DOE.   Non productive cough.

## 2023-07-08 ENCOUNTER — Other Ambulatory Visit: Payer: Self-pay

## 2023-07-08 ENCOUNTER — Emergency Department (HOSPITAL_COMMUNITY): Payer: MEDICAID

## 2023-07-08 ENCOUNTER — Emergency Department (HOSPITAL_COMMUNITY)
Admission: EM | Admit: 2023-07-08 | Discharge: 2023-07-08 | Disposition: A | Discharge status: Home / Self Care | Payer: MEDICAID | Attending: Emergency Medicine | Admitting: Emergency Medicine

## 2023-07-08 DIAGNOSIS — Y9301 Activity, walking, marching and hiking: Secondary | ICD-10-CM | POA: Insufficient documentation

## 2023-07-08 DIAGNOSIS — M79604 Pain in right leg: Secondary | ICD-10-CM | POA: Insufficient documentation

## 2023-07-08 DIAGNOSIS — S3991XA Unspecified injury of abdomen, initial encounter: Secondary | ICD-10-CM | POA: Insufficient documentation

## 2023-07-08 DIAGNOSIS — S0990XA Unspecified injury of head, initial encounter: Secondary | ICD-10-CM | POA: Insufficient documentation

## 2023-07-08 DIAGNOSIS — R0602 Shortness of breath: Secondary | ICD-10-CM | POA: Diagnosis not present

## 2023-07-08 DIAGNOSIS — W19XXXA Unspecified fall, initial encounter: Secondary | ICD-10-CM | POA: Insufficient documentation

## 2023-07-08 DIAGNOSIS — I10 Essential (primary) hypertension: Secondary | ICD-10-CM | POA: Insufficient documentation

## 2023-07-08 DIAGNOSIS — M79605 Pain in left leg: Secondary | ICD-10-CM | POA: Insufficient documentation

## 2023-07-08 LAB — I-STAT CHEM 8, ED
BUN: 14 mg/dL (ref 8–23)
Calcium, Ion: 1.09 mmol/L — ABNORMAL LOW (ref 1.15–1.40)
Chloride: 102 mmol/L (ref 98–111)
Creatinine, Ser: 1.2 mg/dL (ref 0.61–1.24)
Glucose, Bld: 106 mg/dL — ABNORMAL HIGH (ref 70–99)
HCT: 39 % (ref 39.0–52.0)
Hemoglobin: 13.3 g/dL (ref 13.0–17.0)
Potassium: 3.8 mmol/L (ref 3.5–5.1)
Sodium: 136 mmol/L (ref 135–145)
TCO2: 23 mmol/L (ref 22–32)

## 2023-07-08 LAB — URINALYSIS, ROUTINE W REFLEX MICROSCOPIC
Bacteria, UA: NONE SEEN
Bilirubin Urine: NEGATIVE
Glucose, UA: NEGATIVE mg/dL
Ketones, ur: NEGATIVE mg/dL
Leukocytes,Ua: NEGATIVE
Nitrite: NEGATIVE
Protein, ur: NEGATIVE mg/dL
Specific Gravity, Urine: 1.019 (ref 1.005–1.030)
pH: 6 (ref 5.0–8.0)

## 2023-07-08 LAB — VITAMIN B12: Vitamin B-12: 469 pg/mL (ref 180–914)

## 2023-07-08 LAB — CBC
HCT: 36.7 % — ABNORMAL LOW (ref 39.0–52.0)
Hemoglobin: 12.7 g/dL — ABNORMAL LOW (ref 13.0–17.0)
MCH: 29.4 pg (ref 26.0–34.0)
MCHC: 34.6 g/dL (ref 30.0–36.0)
MCV: 85 fL (ref 80.0–100.0)
Platelets: 193 10*3/uL (ref 150–400)
RBC: 4.32 MIL/uL (ref 4.22–5.81)
RDW: 15.2 % (ref 11.5–15.5)
WBC: 7.2 10*3/uL (ref 4.0–10.5)
nRBC: 0 % (ref 0.0–0.2)

## 2023-07-08 LAB — COMPREHENSIVE METABOLIC PANEL
ALT: 13 U/L (ref 0–44)
AST: 18 U/L (ref 15–41)
Albumin: 3.6 g/dL (ref 3.5–5.0)
Alkaline Phosphatase: 53 U/L (ref 38–126)
Anion gap: 12 (ref 5–15)
BUN: 14 mg/dL (ref 8–23)
CO2: 21 mmol/L — ABNORMAL LOW (ref 22–32)
Calcium: 9 mg/dL (ref 8.9–10.3)
Chloride: 102 mmol/L (ref 98–111)
Creatinine, Ser: 1.17 mg/dL (ref 0.61–1.24)
GFR, Estimated: >60 mL/min (ref >60)
Glucose, Bld: 106 mg/dL — ABNORMAL HIGH (ref 70–99)
Potassium: 3.9 mmol/L (ref 3.5–5.1)
Sodium: 135 mmol/L (ref 135–145)
Total Bilirubin: 0.7 mg/dL (ref 0.0–1.2)
Total Protein: 6.9 g/dL (ref 6.5–8.1)

## 2023-07-08 LAB — TROPONIN I (HIGH SENSITIVITY): Troponin I (High Sensitivity): 7 ng/L (ref <18)

## 2023-07-08 LAB — ETHANOL: Alcohol, Ethyl (B): <10 mg/dL (ref <10)

## 2023-07-08 LAB — I-STAT CG4 LACTIC ACID, ED: Lactic Acid, Venous: 0.9 mmol/L (ref 0.5–1.9)

## 2023-07-08 LAB — BRAIN NATRIURETIC PEPTIDE: B Natriuretic Peptide: 18 pg/mL (ref 0.0–100.0)

## 2023-07-08 LAB — CK: Total CK: 194 U/L (ref 49–397)

## 2023-07-08 LAB — MAGNESIUM: Magnesium: 1.7 mg/dL (ref 1.7–2.4)

## 2023-07-08 MED ORDER — LACTATED RINGERS IV BOLUS
500.0000 mL | Freq: Once | INTRAVENOUS | Status: AC
Start: 2023-07-08 — End: 2023-07-08
  Administered 2023-07-08: 500 mL via INTRAVENOUS

## 2023-07-08 MED ORDER — OXYCODONE-ACETAMINOPHEN 5-325 MG PO TABS
1.0000 | ORAL_TABLET | Freq: Once | ORAL | Status: AC
  Administered 2023-07-08: 1 via ORAL
  Filled 2023-07-08: qty 1

## 2023-07-08 NOTE — ED Notes (Signed)
Patient requested cab voucher. Consulting civil engineer notified. Emergency contact called, message left voicemail for return call regarding transport for patient. Patient requesting to speak to charge RN.

## 2023-07-08 NOTE — ED Notes (Signed)
Patient again yelling and screaming obscenities  that he "wants to leave."

## 2023-07-08 NOTE — ED Notes (Signed)
Pt given a taxi voucher. He is A&OX4, ambulatory with steady gait with his cane. Pt reports he has a key to get inside. Pt wheeled out to the lobby to await taxi.

## 2023-07-08 NOTE — ED Provider Notes (Signed)
[place] EMERGENCY DEPARTMENT AT [facility] Provider Note   CSN: [id number] Arrival date & time: 07/08/23  1908     History  No chief complaint on file.   Reginald Martin is a 63 y.o. male.  HPI Patient presents for fall.  Medical history includes osteoporosis, prior alcohol abuse, HTN, chronic hepatitis C.  He states that he has not drink any alcohol over the past several years.  He has been walking with a cane for the past 6 months due to balance issues.  He states that he was in his normal state of health earlier today.  While walking outside, he had a mechanical fall.  He states he was walking downhill and his feet got moving too fast for him to catch his balance.  He fell forward.  He did strike his head.  He denies use of blood thinners.  EMS was called to the scene.  EMS reports that he required significant assistance to even stand.  He was ataxic upon standing.  Vital signs were notable for moderate hypertension.  CBG was in the 150s.  Patient endorses pain in bilateral leg musculature.  He states that this is not new.    Home Medications Prior to Admission medications   Medication Sig Start Date End Date Taking? Authorizing Provider  divalproex (DEPAKOTE) 500 MG DR tablet Take 1 tablet (500 mg total) by mouth 2 (two) times daily. 11/04/22 12/04/22  Augusto Gamble, MD  INVEGA SUSTENNA 156 MG/ML SUSY injection Inject 156 mg into the muscle every 28 (twenty-eight) days. 09/13/22   [provider]  traZODone (DESYREL) 50 MG tablet Take 1 tablet (50 mg total) by mouth at bedtime as needed for sleep. 11/04/22 12/04/22  Augusto Gamble, MD  trihexyphenidyl (ARTANE) 2 MG tablet Take 1 tablet (2 mg total) by mouth 2 (two) times daily. 11/04/22 12/04/22  Augusto Gamble, MD      Allergies    Tylenol [acetaminophen]    Review of Systems   Review of Systems  Respiratory:  Positive for shortness of breath.   Musculoskeletal:  Positive for gait problem and myalgias.  Skin:  Positive  for wound.  Neurological:  Positive for weakness (Generalized).  All other systems reviewed and are negative.   Physical Exam Updated Vital Signs BP 135/69   Pulse 100   Temp 99.7 F (37.6 C)   Resp 18   SpO2 95%  Physical Exam Vitals and nursing note reviewed.  Constitutional:      General: He is not in acute distress.    Appearance: Normal appearance. He is well-developed. He is not ill-appearing, toxic-appearing or diaphoretic.  HENT:     Head: Normocephalic and atraumatic.     Right Ear: External ear normal.     Left Ear: External ear normal.     Nose: Nose normal.     Mouth/Throat:     Mouth: Mucous membranes are moist.  Eyes:     Extraocular Movements: Extraocular movements intact.     Conjunctiva/sclera: Conjunctivae normal.  Cardiovascular:     Rate and Rhythm: Normal rate and regular rhythm.     Heart sounds: No murmur heard. Pulmonary:     Effort: Pulmonary effort is normal. No respiratory distress.     Breath sounds: Normal breath sounds. No wheezing or rales.  Chest:     Chest wall: No tenderness.  Abdominal:     General: There is no distension.     Palpations: Abdomen is soft.  Tenderness: There is no abdominal tenderness.  Musculoskeletal:        General: Tenderness present. No swelling or deformity.     Cervical back: Normal range of motion and neck supple.     Right lower leg: No edema.     Left lower leg: No edema.  Skin:    General: Skin is warm and dry.     Coloration: Skin is not jaundiced or pale.  Neurological:     General: No focal deficit present.     Mental Status: He is alert and oriented to person, place, and time.     Cranial Nerves: No cranial nerve deficit.     Sensory: No sensory deficit.     Motor: No weakness.     Coordination: Coordination normal.  Psychiatric:        Mood and Affect: Mood normal.        Behavior: Behavior normal.     ED Results / Procedures / Treatments   Labs (all labs ordered are listed, but only  abnormal results are displayed) Labs Reviewed  COMPREHENSIVE METABOLIC PANEL - Abnormal; Notable for the following components:      Result Value   CO2 21 (*)    Glucose, Bld 106 (*)    All other components within normal limits  CBC - Abnormal; Notable for the following components:   Hemoglobin 12.7 (*)    HCT 36.7 (*)    All other components within normal limits  URINALYSIS, ROUTINE W REFLEX MICROSCOPIC - Abnormal; Notable for the following components:   Hgb urine dipstick MODERATE (*)    All other components within normal limits  I-STAT CHEM 8, ED - Abnormal; Notable for the following components:   Glucose, Bld 106 (*)    Calcium, Ion 1.09 (*)    All other components within normal limits  ETHANOL  CK  MAGNESIUM  VITAMIN B12  BRAIN NATRIURETIC PEPTIDE  VITAMIN B1  I-STAT CG4 LACTIC ACID, ED  TROPONIN I (HIGH SENSITIVITY)  TROPONIN I (HIGH SENSITIVITY)    EKG EKG Interpretation Date/Time:  Saturday July 08 2023 19:40:37 EST Ventricular Rate:  94 PR Interval:  141 QRS Duration:  101 QT Interval:  351 QTC Calculation: 439 R Axis:   74  Text Interpretation: Sinus rhythm Biatrial enlargement Artifact in lead(s) I II III aVR aVL aVF V1 V2 Confirmed by Gloris Manchester ([id number]) on 07/08/2023 8:05:52 PM  Radiology CT CHEST ABDOMEN PELVIS WO CONTRAST Result Date: 07/08/2023 CLINICAL DATA:  Weakness, fall, blunt chest and abdominal trauma, altered mental status EXAM: CT CHEST, ABDOMEN AND PELVIS WITHOUT CONTRAST TECHNIQUE: Multidetector CT imaging of the chest, abdomen and pelvis was performed following the standard protocol without IV contrast. RADIATION DOSE REDUCTION: This exam was performed according to the departmental dose-optimization program which includes automated exposure control, adjustment of the mA and/or kV according to patient size and/or use of iterative reconstruction technique. COMPARISON:  None Available. FINDINGS: CT CHEST FINDINGS Cardiovascular: No significant  vascular findings. Normal heart size. No pericardial effusion. Mediastinum/Nodes: No enlarged mediastinal, hilar, or axillary lymph nodes. Thyroid gland, trachea, and esophagus demonstrate no significant findings. Lungs/Pleura: Evaluation of the pulmonary parenchyma is limited by respiratory motion artifact. No focal pulmonary infiltrate. No pneumothorax. Trace bilateral pleural effusions. No central obstructing lesion. Musculoskeletal: No acute bone abnormality. No lytic or blastic bone lesion. CT ABDOMEN PELVIS FINDINGS Hepatobiliary: No focal liver abnormality is seen. No gallstones, gallbladder wall thickening, or biliary dilatation. Pancreas: Unremarkable Spleen: Unremarkable Adrenals/Urinary Tract: Adrenal glands  are unremarkable. Kidneys are normal, without renal calculi, focal lesion, or hydronephrosis. Bladder is unremarkable. Stomach/Bowel: Moderate colonic stool burden. The stomach, small bowel, and large bowel are otherwise unremarkable. No evidence of obstruction or focal inflammation. No free intraperitoneal gas or fluid. Appendix normal. Vascular/Lymphatic: Aortic atherosclerosis. No enlarged abdominal or pelvic lymph nodes. Reproductive: Prostate is unremarkable. Other: No abdominal wall hernia Musculoskeletal: No acute bone abnormality. No lytic or blastic bone lesion IMPRESSION: 1. No acute intrathoracic or intra-abdominal injury. 2. Trace bilateral pleural effusions. 3. Moderate colonic stool burden. Aortic Atherosclerosis (ICD10-I70.0). Electronically Signed   By: Helyn Numbers M.D.   On: 07/08/2023 21:09   CT L-SPINE NO CHARGE Result Date: 07/08/2023 CLINICAL DATA:  Weakness, fall, blunt chest and abdominal trauma, altered mental status EXAM: CT Thoracic and Lumbar spine without contrast TECHNIQUE: Multiplanar CT images of the thoracic and lumbar spine were reconstructed from contemporary CT of the Chest, Abdomen, and Pelvis. RADIATION DOSE REDUCTION: This exam was performed according to the  departmental dose-optimization program which includes automated exposure control, adjustment of the mA and/or kV according to patient size and/or use of iterative reconstruction technique. CONTRAST:  None COMPARISON:  None Available. FINDINGS: CT THORACIC SPINE FINDINGS Alignment: Normal. Vertebrae: No acute fracture or focal pathologic process. Paraspinal and other soft tissues: Negative. Disc levels: Intervertebral disc heights are preserved. Endplate remodeling and osteophyte formation at T7-T10 is present in keeping with changes of mild degenerative disc disease. No high-grade canal stenosis. Moderate left neuroforaminal narrowing at T10-11 and T11-12. No high-grade neuroforaminal narrowing. CT LUMBAR SPINE FINDINGS Segmentation: Transitional lumbar anatomy with well-formed disc space at S1-2 and a partially lumbarized S1. Alignment: Normal. Vertebrae: No acute fracture or focal pathologic process. Paraspinal and other soft tissues: Negative. Disc levels: Mild intervertebral disc space narrowing at L4-5 and mild endplate remodeling at L3-L5 in keeping with changes of mild degenerative disc disease. No high-grade canal stenosis. Moderate multilevel facet arthrosis throughout the lumbar spine. No high-grade neuroforaminal narrowing. IMPRESSION: 1. No acute fracture or listhesis of the thoracic or lumbar spine. 2. Mild degenerative disc disease and moderate multilevel lumbar facet arthrosis. No high-grade canal stenosis. Moderate left neuroforaminal narrowing at T10-11 and T11-12. Electronically Signed   By: Helyn Numbers M.D.   On: 07/08/2023 21:03   CT T-SPINE NO CHARGE Result Date: 07/08/2023 CLINICAL DATA:  Weakness, fall, blunt chest and abdominal trauma, altered mental status EXAM: CT Thoracic and Lumbar spine without contrast TECHNIQUE: Multiplanar CT images of the thoracic and lumbar spine were reconstructed from contemporary CT of the Chest, Abdomen, and Pelvis. RADIATION DOSE REDUCTION: This exam was  performed according to the departmental dose-optimization program which includes automated exposure control, adjustment of the mA and/or kV according to patient size and/or use of iterative reconstruction technique. CONTRAST:  None COMPARISON:  None Available. FINDINGS: CT THORACIC SPINE FINDINGS Alignment: Normal. Vertebrae: No acute fracture or focal pathologic process. Paraspinal and other soft tissues: Negative. Disc levels: Intervertebral disc heights are preserved. Endplate remodeling and osteophyte formation at T7-T10 is present in keeping with changes of mild degenerative disc disease. No high-grade canal stenosis. Moderate left neuroforaminal narrowing at T10-11 and T11-12. No high-grade neuroforaminal narrowing. CT LUMBAR SPINE FINDINGS Segmentation: Transitional lumbar anatomy with well-formed disc space at S1-2 and a partially lumbarized S1. Alignment: Normal. Vertebrae: No acute fracture or focal pathologic process. Paraspinal and other soft tissues: Negative. Disc levels: Mild intervertebral disc space narrowing at L4-5 and mild endplate remodeling at L3-L5 in keeping with changes of  mild degenerative disc disease. No high-grade canal stenosis. Moderate multilevel facet arthrosis throughout the lumbar spine. No high-grade neuroforaminal narrowing. IMPRESSION: 1. No acute fracture or listhesis of the thoracic or lumbar spine. 2. Mild degenerative disc disease and moderate multilevel lumbar facet arthrosis. No high-grade canal stenosis. Moderate left neuroforaminal narrowing at T10-11 and T11-12. Electronically Signed   By: Helyn Numbers M.D.   On: 07/08/2023 21:03   CT HEAD WO CONTRAST Result Date: 07/08/2023 CLINICAL DATA:  Head and neck trauma. EXAM: CT HEAD WITHOUT CONTRAST CT CERVICAL SPINE WITHOUT CONTRAST TECHNIQUE: Multidetector CT imaging of the head and cervical spine was performed following the standard protocol without intravenous contrast. Multiplanar CT image reconstructions of the  cervical spine were also generated. RADIATION DOSE REDUCTION: This exam was performed according to the departmental dose-optimization program which includes automated exposure control, adjustment of the mA and/or kV according to patient size and/or use of iterative reconstruction technique. COMPARISON:  None Available. FINDINGS: CT HEAD FINDINGS Brain: No acute intracranial hemorrhage, midline shift or mass effect. No extra-axial fluid collection. Mild periventricular white matter hypodensities are present bilaterally. No hydrocephalus. Vascular: No hyperdense vessel or unexpected calcification. Skull: Normal. Negative for fracture or focal lesion. Sinuses/Orbits: No acute finding. Other: None. CT CERVICAL SPINE FINDINGS Alignment: Normal. Skull base and vertebrae: No acute fracture. No primary bone lesion or focal pathologic process. Soft tissues and spinal canal: No prevertebral fluid or swelling. No visible canal hematoma. Disc levels: Multilevel intervertebral disc space narrowing, degenerative endplate changes, disc osteophyte complexes with severe spinal canal stenosis at C4-C5 and C5-C6. Upper chest: No acute abnormality. Other: None. IMPRESSION: 1. No acute intracranial process. 2. No evidence of cervical spine fracture. 3. Multilevel degenerative changes in the cervical spine with severe spinal canal stenosis at C4-C5 and C5-C6. Electronically Signed   By: Thornell Sartorius M.D.   On: 07/08/2023 20:54   CT CERVICAL SPINE WO CONTRAST Result Date: 07/08/2023 CLINICAL DATA:  Head and neck trauma. EXAM: CT HEAD WITHOUT CONTRAST CT CERVICAL SPINE WITHOUT CONTRAST TECHNIQUE: Multidetector CT imaging of the head and cervical spine was performed following the standard protocol without intravenous contrast. Multiplanar CT image reconstructions of the cervical spine were also generated. RADIATION DOSE REDUCTION: This exam was performed according to the departmental dose-optimization program which includes automated  exposure control, adjustment of the mA and/or kV according to patient size and/or use of iterative reconstruction technique. COMPARISON:  None Available. FINDINGS: CT HEAD FINDINGS Brain: No acute intracranial hemorrhage, midline shift or mass effect. No extra-axial fluid collection. Mild periventricular white matter hypodensities are present bilaterally. No hydrocephalus. Vascular: No hyperdense vessel or unexpected calcification. Skull: Normal. Negative for fracture or focal lesion. Sinuses/Orbits: No acute finding. Other: None. CT CERVICAL SPINE FINDINGS Alignment: Normal. Skull base and vertebrae: No acute fracture. No primary bone lesion or focal pathologic process. Soft tissues and spinal canal: No prevertebral fluid or swelling. No visible canal hematoma. Disc levels: Multilevel intervertebral disc space narrowing, degenerative endplate changes, disc osteophyte complexes with severe spinal canal stenosis at C4-C5 and C5-C6. Upper chest: No acute abnormality. Other: None. IMPRESSION: 1. No acute intracranial process. 2. No evidence of cervical spine fracture. 3. Multilevel degenerative changes in the cervical spine with severe spinal canal stenosis at C4-C5 and C5-C6. Electronically Signed   By: Thornell Sartorius M.D.   On: 07/08/2023 20:54    Procedures Procedures    Medications Ordered in ED Medications  lactated ringers bolus 500 mL (0 mLs Intravenous Stopped 07/08/23 2111)  oxyCODONE-acetaminophen (PERCOCET/ROXICET) 5-325 MG per tablet 1 tablet (1 tablet Oral Given 07/08/23 1956)    ED Course/ Medical Decision Making/ A&P                                 Medical Decision Making Amount and/or Complexity of Data Reviewed Labs: ordered. Radiology: ordered.  Risk Prescription drug management.   This patient presents to the ED for concern of fall, this involves an extensive number of treatment options, and is a complaint that carries with it a high risk of complications and morbidity.  The  differential diagnosis includes acute injuries   Co morbidities that complicate the patient evaluation  osteoporosis, prior alcohol abuse, HTN, chronic hepatitis C   Additional history obtained:  Additional history obtained from EMS External records from outside source obtained and reviewed including EMR   Lab Tests:  I Ordered, and personally interpreted labs.  The pertinent results include: Normal kidney function, normal electrolytes, normal hemoglobin, no leukocytosis, normal troponin   Imaging Studies ordered:  I ordered imaging studies including CT of head, cervical spine, chest, abdomen, pelvis, T-spine, L-spine I independently visualized and interpreted imaging which showed no acute findings I agree with the radiologist interpretation   Cardiac Monitoring: / EKG:  The patient was maintained on a cardiac monitor.  I personally viewed and interpreted the cardiac monitored which showed an underlying rhythm of: Sinus rhythm  Problem List / ED Course / Critical interventions / Medication management  Patient presenting after a ground-level fall.  He describes the fall as mechanical caused by him losing his balance.  He has balance issues at baseline and does ambulate with a cane.  EMS noted striking generalized weakness when attempted to stand him up.  On arrival, patient is overall well-appearing.  He has some leg myalgias but denies any other areas of pain.  No deformities are noted on exam.  He has a mild abrasion to left temporal area.  Per chart review, tetanus was updated 2 years ago.  Patient was placed on bedside cardiac monitor.  Workup was initiated.  Patient has some exertional shortness of breath when getting his clothes off.  On inspection of his legs, he has preserved strength and sensation bilaterally.  He has some mild abrasions.  He has tenderness in upper and lower muscles.  Patient reports that he has not eaten today.  IV fluids were ordered for hydration.   Percocet was ordered for analgesia.  Patient's lab work, including CK was unremarkable.  He underwent complete trauma scans no acute findings were identified.  Patient was able to stand to urinate on his own.  He requested to be discharged.  On trial of ambulation, patient was able to ambulate with his cane, without difficulty.  He was discharged in stable condition. I ordered medication including IV fluids for hydration; Percocet for analgesia Reevaluation of the patient after these medicines showed that the patient improved I have reviewed the patients home medicines and have made adjustments as needed   Social Determinants of Health:  Lives independently        Final Clinical Impression(s) / ED Diagnoses Final diagnoses:  Fall, initial encounter    Rx / DC Orders ED Discharge Orders     None         Gloris Manchester, MD 07/08/23 2141

## 2023-07-08 NOTE — ED Notes (Signed)
Patient table to ambulate in hall with cane.

## 2023-07-08 NOTE — ED Notes (Signed)
Patient yelling and screaming obscenities and that he wants something to eat. MD states that patient can eat. Patient provided food.

## 2023-07-08 NOTE — Discharge Instructions (Addendum)
 Test results today are reassuring.  Take ibuprofen and Tylenol as needed for pain and soreness.  Return to the emergency department for any new or worsening symptoms of concern.

## 2023-07-09 ENCOUNTER — Emergency Department (HOSPITAL_COMMUNITY): Payer: MEDICAID

## 2023-07-09 ENCOUNTER — Other Ambulatory Visit: Payer: Self-pay

## 2023-07-09 ENCOUNTER — Encounter (HOSPITAL_COMMUNITY): Payer: Self-pay

## 2023-07-09 ENCOUNTER — Emergency Department (HOSPITAL_COMMUNITY)
Admission: EM | Admit: 2023-07-09 | Discharge: 2023-07-09 | Disposition: A | Discharge status: Home / Self Care | Payer: MEDICAID | Attending: Emergency Medicine | Admitting: Emergency Medicine

## 2023-07-09 DIAGNOSIS — W19XXXA Unspecified fall, initial encounter: Secondary | ICD-10-CM | POA: Insufficient documentation

## 2023-07-09 DIAGNOSIS — S63125A Dislocation of unspecified interphalangeal joint of left thumb, initial encounter: Secondary | ICD-10-CM | POA: Insufficient documentation

## 2023-07-09 DIAGNOSIS — Z1152 Encounter for screening for COVID-19: Secondary | ICD-10-CM | POA: Insufficient documentation

## 2023-07-09 DIAGNOSIS — M79645 Pain in left finger(s): Secondary | ICD-10-CM | POA: Diagnosis present

## 2023-07-09 DIAGNOSIS — I10 Essential (primary) hypertension: Secondary | ICD-10-CM | POA: Diagnosis not present

## 2023-07-09 LAB — RESP PANEL BY RT-PCR (RSV, FLU A&B, COVID)  RVPGX2
Influenza A by PCR: NEGATIVE
Influenza B by PCR: NEGATIVE
Resp Syncytial Virus by PCR: NEGATIVE
SARS Coronavirus 2 by RT PCR: NEGATIVE

## 2023-07-09 MED ORDER — IBUPROFEN 400 MG PO TABS
400.0000 mg | ORAL_TABLET | Freq: Once | ORAL | Status: AC | PRN
  Administered 2023-07-09: 400 mg via ORAL
  Filled 2023-07-09: qty 1

## 2023-07-09 MED ORDER — LIDOCAINE HCL (PF) 1 % IJ SOLN
5.0000 mL | Freq: Once | INTRAMUSCULAR | Status: AC
  Administered 2023-07-09: 5 mL via INTRADERMAL
  Filled 2023-07-09: qty 5

## 2023-07-09 MED ORDER — OXYCODONE HCL 5 MG PO TABS
5.0000 mg | ORAL_TABLET | Freq: Once | ORAL | Status: AC
  Administered 2023-07-09: 5 mg via ORAL
  Filled 2023-07-09: qty 1

## 2023-07-09 NOTE — ED Provider Notes (Signed)
Patient care taken over at shift change. Disposition pending reduction x-ray and valproic acid level. Physical Exam  BP 116/77 (BP Location: Right Arm)   Pulse 95   Temp 98.8 F (37.1 C) (Oral)   Resp 16   Ht 6\' 2"  (1.88 m)   Wt 97.5 kg   SpO2 95%   BMI 27.60 kg/m   Physical Exam  Procedures  Procedures  ED Course / MDM    Medical Decision Making Amount and/or Complexity of Data Reviewed Labs: ordered. Radiology: ordered.  Risk Prescription drug management.   Post-reduction imaging showed successful reduction of thumb. Prior PA taking care of patient splinted finger.  Information for hand surgery provided for follow-up.  Patient is stable and safe for discharge home. Return precautions given.   Maxwell Marion, PA-C 07/09/23 2239    Melene Plan, DO 07/09/23 2242

## 2023-07-09 NOTE — ED Provider Notes (Cosign Needed Addendum)
[place] EMERGENCY DEPARTMENT AT [facility] Provider Note   CSN: [id number] Arrival date & time: 07/09/23  1143     History  Chief Complaint  Patient presents with   Reginald Martin    Reginald Martin is a 63 y.o. male past Medical history includes osteoporosis, prior alcohol abuse, HTN, chronic hepatitis C presented for left thumb pain after a fall.  Patient was seen last night due to chronic falls and was ultimately discharged.  Patient is some he went home he fell landed with his left hand outstretched.  Patient has had her neck injury.  Patient is on any blood thinners.  Patient states that there is deformity to his thumb but he still feel move his thumb.  Patient states that there have been no changes in symptoms since being discharged.  Patient is unsure of fevers, denies chest pain/shortness of breath/abdominal pain/nausea vomiting/head or neck pain/vision changes.  Patient states he does have chronic bilateral leg weakness that has been around for "a long time."  Home Medications Prior to Admission medications   Medication Sig Start Date End Date Taking? Authorizing Provider  divalproex (DEPAKOTE) 500 MG DR tablet Take 1 tablet (500 mg total) by mouth 2 (two) times daily. 11/04/22 12/04/22  Augusto Gamble, MD  INVEGA SUSTENNA 156 MG/ML SUSY injection Inject 156 mg into the muscle every 28 (twenty-eight) days. 09/13/22   [provider]  traZODone (DESYREL) 50 MG tablet Take 1 tablet (50 mg total) by mouth at bedtime as needed for sleep. 11/04/22 12/04/22  Augusto Gamble, MD  trihexyphenidyl (ARTANE) 2 MG tablet Take 1 tablet (2 mg total) by mouth 2 (two) times daily. 11/04/22 12/04/22  Augusto Gamble, MD      Allergies    Tylenol [acetaminophen]    Review of Systems   Review of Systems  Physical Exam Updated Vital Signs BP 116/77 (BP Location: Right Arm)   Pulse 95   Temp (!) 100.8 F (38.2 C)   Resp 16   Ht 6\' 2"  (1.88 m)   Wt 97.5 kg   SpO2 95%   BMI 27.60 kg/m   Physical Exam Vitals reviewed.  Constitutional:      General: He is not in acute distress. Cardiovascular:     Rate and Rhythm: Normal rate.     Pulses: Normal pulses.  Musculoskeletal:     Comments: Left thumb: Obvious deformity noted however able to range thumb at the IP joint, sensation intact distally, no signs of open wounds Pain not out of proportion Soft compartments  Skin:    General: Skin is warm and dry.     Capillary Refill: Capillary refill takes less than 2 seconds.  Neurological:     Mental Status: He is alert.     Comments: Sensation intact distally  Psychiatric:        Mood and Affect: Mood normal.     ED Results / Procedures / Treatments   Labs (all labs ordered are listed, but only abnormal results are displayed) Labs Reviewed  RESP PANEL BY RT-PCR (RSV, FLU A&B, COVID)  [id number]  VALPROIC ACID LEVEL    EKG None  Radiology DG Hand Complete Left Result Date: 07/09/2023 CLINICAL DATA:  Pain after fall EXAM: LEFT HAND - COMPLETE 3 VIEW COMPARISON:  None Available. FINDINGS: There is dislocation of the first metacarpophalangeal joint. The phalanges are anterior with some foreshortening. Recommend follow up x-ray after relocation to assess for subtle fracture. No additional fracture seen at this time. Degenerative changes  seen in the fifth digit. Preserved bone mineralization. IMPRESSION: Dislocation of the metacarpophalangeal joint of the thumb with some overriding of fragments. Recommend follow up imaging after relocation to assess for underlying bony injury Electronically Signed   By: Karen Kays M.D.   On: 07/09/2023 12:51   CT CHEST ABDOMEN PELVIS WO CONTRAST Result Date: 07/08/2023 CLINICAL DATA:  Weakness, fall, blunt chest and abdominal trauma, altered mental status EXAM: CT CHEST, ABDOMEN AND PELVIS WITHOUT CONTRAST TECHNIQUE: Multidetector CT imaging of the chest, abdomen and pelvis was performed following the standard protocol without IV contrast.  RADIATION DOSE REDUCTION: This exam was performed according to the departmental dose-optimization program which includes automated exposure control, adjustment of the mA and/or kV according to patient size and/or use of iterative reconstruction technique. COMPARISON:  None Available. FINDINGS: CT CHEST FINDINGS Cardiovascular: No significant vascular findings. Normal heart size. No pericardial effusion. Mediastinum/Nodes: No enlarged mediastinal, hilar, or axillary lymph nodes. Thyroid gland, trachea, and esophagus demonstrate no significant findings. Lungs/Pleura: Evaluation of the pulmonary parenchyma is limited by respiratory motion artifact. No focal pulmonary infiltrate. No pneumothorax. Trace bilateral pleural effusions. No central obstructing lesion. Musculoskeletal: No acute bone abnormality. No lytic or blastic bone lesion. CT ABDOMEN PELVIS FINDINGS Hepatobiliary: No focal liver abnormality is seen. No gallstones, gallbladder wall thickening, or biliary dilatation. Pancreas: Unremarkable Spleen: Unremarkable Adrenals/Urinary Tract: Adrenal glands are unremarkable. Kidneys are normal, without renal calculi, focal lesion, or hydronephrosis. Bladder is unremarkable. Stomach/Bowel: Moderate colonic stool burden. The stomach, small bowel, and large bowel are otherwise unremarkable. No evidence of obstruction or focal inflammation. No free intraperitoneal gas or fluid. Appendix normal. Vascular/Lymphatic: Aortic atherosclerosis. No enlarged abdominal or pelvic lymph nodes. Reproductive: Prostate is unremarkable. Other: No abdominal wall hernia Musculoskeletal: No acute bone abnormality. No lytic or blastic bone lesion IMPRESSION: 1. No acute intrathoracic or intra-abdominal injury. 2. Trace bilateral pleural effusions. 3. Moderate colonic stool burden. Aortic Atherosclerosis (ICD10-I70.0). Electronically Signed   By: Helyn Numbers M.D.   On: 07/08/2023 21:09   CT L-SPINE NO CHARGE Result Date:  07/08/2023 CLINICAL DATA:  Weakness, fall, blunt chest and abdominal trauma, altered mental status EXAM: CT Thoracic and Lumbar spine without contrast TECHNIQUE: Multiplanar CT images of the thoracic and lumbar spine were reconstructed from contemporary CT of the Chest, Abdomen, and Pelvis. RADIATION DOSE REDUCTION: This exam was performed according to the departmental dose-optimization program which includes automated exposure control, adjustment of the mA and/or kV according to patient size and/or use of iterative reconstruction technique. CONTRAST:  None COMPARISON:  None Available. FINDINGS: CT THORACIC SPINE FINDINGS Alignment: Normal. Vertebrae: No acute fracture or focal pathologic process. Paraspinal and other soft tissues: Negative. Disc levels: Intervertebral disc heights are preserved. Endplate remodeling and osteophyte formation at T7-T10 is present in keeping with changes of mild degenerative disc disease. No high-grade canal stenosis. Moderate left neuroforaminal narrowing at T10-11 and T11-12. No high-grade neuroforaminal narrowing. CT LUMBAR SPINE FINDINGS Segmentation: Transitional lumbar anatomy with well-formed disc space at S1-2 and a partially lumbarized S1. Alignment: Normal. Vertebrae: No acute fracture or focal pathologic process. Paraspinal and other soft tissues: Negative. Disc levels: Mild intervertebral disc space narrowing at L4-5 and mild endplate remodeling at L3-L5 in keeping with changes of mild degenerative disc disease. No high-grade canal stenosis. Moderate multilevel facet arthrosis throughout the lumbar spine. No high-grade neuroforaminal narrowing. IMPRESSION: 1. No acute fracture or listhesis of the thoracic or lumbar spine. 2. Mild degenerative disc disease and moderate multilevel lumbar facet arthrosis. No  high-grade canal stenosis. Moderate left neuroforaminal narrowing at T10-11 and T11-12. Electronically Signed   By: Helyn Numbers M.D.   On: 07/08/2023 21:03   CT  T-SPINE NO CHARGE Result Date: 07/08/2023 CLINICAL DATA:  Weakness, fall, blunt chest and abdominal trauma, altered mental status EXAM: CT Thoracic and Lumbar spine without contrast TECHNIQUE: Multiplanar CT images of the thoracic and lumbar spine were reconstructed from contemporary CT of the Chest, Abdomen, and Pelvis. RADIATION DOSE REDUCTION: This exam was performed according to the departmental dose-optimization program which includes automated exposure control, adjustment of the mA and/or kV according to patient size and/or use of iterative reconstruction technique. CONTRAST:  None COMPARISON:  None Available. FINDINGS: CT THORACIC SPINE FINDINGS Alignment: Normal. Vertebrae: No acute fracture or focal pathologic process. Paraspinal and other soft tissues: Negative. Disc levels: Intervertebral disc heights are preserved. Endplate remodeling and osteophyte formation at T7-T10 is present in keeping with changes of mild degenerative disc disease. No high-grade canal stenosis. Moderate left neuroforaminal narrowing at T10-11 and T11-12. No high-grade neuroforaminal narrowing. CT LUMBAR SPINE FINDINGS Segmentation: Transitional lumbar anatomy with well-formed disc space at S1-2 and a partially lumbarized S1. Alignment: Normal. Vertebrae: No acute fracture or focal pathologic process. Paraspinal and other soft tissues: Negative. Disc levels: Mild intervertebral disc space narrowing at L4-5 and mild endplate remodeling at L3-L5 in keeping with changes of mild degenerative disc disease. No high-grade canal stenosis. Moderate multilevel facet arthrosis throughout the lumbar spine. No high-grade neuroforaminal narrowing. IMPRESSION: 1. No acute fracture or listhesis of the thoracic or lumbar spine. 2. Mild degenerative disc disease and moderate multilevel lumbar facet arthrosis. No high-grade canal stenosis. Moderate left neuroforaminal narrowing at T10-11 and T11-12. Electronically Signed   By: Helyn Numbers M.D.    On: 07/08/2023 21:03   CT HEAD WO CONTRAST Result Date: 07/08/2023 CLINICAL DATA:  Head and neck trauma. EXAM: CT HEAD WITHOUT CONTRAST CT CERVICAL SPINE WITHOUT CONTRAST TECHNIQUE: Multidetector CT imaging of the head and cervical spine was performed following the standard protocol without intravenous contrast. Multiplanar CT image reconstructions of the cervical spine were also generated. RADIATION DOSE REDUCTION: This exam was performed according to the departmental dose-optimization program which includes automated exposure control, adjustment of the mA and/or kV according to patient size and/or use of iterative reconstruction technique. COMPARISON:  None Available. FINDINGS: CT HEAD FINDINGS Brain: No acute intracranial hemorrhage, midline shift or mass effect. No extra-axial fluid collection. Mild periventricular white matter hypodensities are present bilaterally. No hydrocephalus. Vascular: No hyperdense vessel or unexpected calcification. Skull: Normal. Negative for fracture or focal lesion. Sinuses/Orbits: No acute finding. Other: None. CT CERVICAL SPINE FINDINGS Alignment: Normal. Skull base and vertebrae: No acute fracture. No primary bone lesion or focal pathologic process. Soft tissues and spinal canal: No prevertebral fluid or swelling. No visible canal hematoma. Disc levels: Multilevel intervertebral disc space narrowing, degenerative endplate changes, disc osteophyte complexes with severe spinal canal stenosis at C4-C5 and C5-C6. Upper chest: No acute abnormality. Other: None. IMPRESSION: 1. No acute intracranial process. 2. No evidence of cervical spine fracture. 3. Multilevel degenerative changes in the cervical spine with severe spinal canal stenosis at C4-C5 and C5-C6. Electronically Signed   By: Thornell Sartorius M.D.   On: 07/08/2023 20:54   CT CERVICAL SPINE WO CONTRAST Result Date: 07/08/2023 CLINICAL DATA:  Head and neck trauma. EXAM: CT HEAD WITHOUT CONTRAST CT CERVICAL SPINE WITHOUT  CONTRAST TECHNIQUE: Multidetector CT imaging of the head and cervical spine was performed following the standard  protocol without intravenous contrast. Multiplanar CT image reconstructions of the cervical spine were also generated. RADIATION DOSE REDUCTION: This exam was performed according to the departmental dose-optimization program which includes automated exposure control, adjustment of the mA and/or kV according to patient size and/or use of iterative reconstruction technique. COMPARISON:  None Available. FINDINGS: CT HEAD FINDINGS Brain: No acute intracranial hemorrhage, midline shift or mass effect. No extra-axial fluid collection. Mild periventricular white matter hypodensities are present bilaterally. No hydrocephalus. Vascular: No hyperdense vessel or unexpected calcification. Skull: Normal. Negative for fracture or focal lesion. Sinuses/Orbits: No acute finding. Other: None. CT CERVICAL SPINE FINDINGS Alignment: Normal. Skull base and vertebrae: No acute fracture. No primary bone lesion or focal pathologic process. Soft tissues and spinal canal: No prevertebral fluid or swelling. No visible canal hematoma. Disc levels: Multilevel intervertebral disc space narrowing, degenerative endplate changes, disc osteophyte complexes with severe spinal canal stenosis at C4-C5 and C5-C6. Upper chest: No acute abnormality. Other: None. IMPRESSION: 1. No acute intracranial process. 2. No evidence of cervical spine fracture. 3. Multilevel degenerative changes in the cervical spine with severe spinal canal stenosis at C4-C5 and C5-C6. Electronically Signed   By: Thornell Sartorius M.D.   On: 07/08/2023 20:54    Procedures .Reduction of dislocation  Date/Time: 07/09/2023 2:56 PM  Performed by: Netta Corrigan, PA-C Authorized by: Netta Corrigan, PA-C  Consent: Verbal consent obtained. Risks and benefits: risks, benefits and alternatives were discussed Consent given by: patient Imaging studies: imaging studies  available Local anesthesia used: yes Anesthesia: digital block  Anesthesia: Local anesthesia used: yes Local Anesthetic: lidocaine 1% without epinephrine Anesthetic total: 5 mL Patient tolerance: patient tolerated the procedure well with no immediate complications Comments: Initially reduction film after the first reduction did not appear in place and so went back and repeated the reduction and will we will repeat the reduction film.  Patient was neuro vas intact after both reductions.       Medications Ordered in ED Medications  ibuprofen (ADVIL) tablet 400 mg (400 mg Oral Given 07/09/23 1153)  lidocaine (PF) (XYLOCAINE) 1 % injection 5 mL (5 mLs Intradermal Given by Other 07/09/23 1437)  oxyCODONE (Oxy IR/ROXICODONE) immediate release tablet 5 mg (5 mg Oral Given 07/09/23 1434)    ED Course/ Medical Decision Making/ A&P                                 Medical Decision Making Amount and/or Complexity of Data Reviewed Labs: ordered. Radiology: ordered.  Risk Prescription drug management.   Awan Slavich 63 y.o. presented today for fall. Working DDx that I considered at this time includes, but not limited to, vasovagal episode, mechanical fall, ICH, epidural/subdural hematoma, basilar skull fracture, anemia, electrolyte abnormalities, drug-induced, arrhythmia, UTI, fracture, contusion, soft tissue injury.  R/o DDx: vasovagal episode, mechanical fall, ICH, epidural/subdural hematoma, basilar skull fracture, anemia, electrolyte abnormalities, drug-induced, arrhythmia, UTI, fracture, contusion, soft tissue injury: These are considered less likely due to history of present illness, physical exam, lab/imaging findings  Review of prior external notes: 07/08/2023 ED  Unique Tests and My Interpretation:  Respiratory panel: pending Depakote level: pending Left hand x-ray: Left thumb dislocation; pending  Social Determinants of Health: EtOH/Substance Abuse  Discussion with  Independent Historian: None  Discussion of Management of Tests: None  Risk: Medium: prescription drug management  Risk Stratification Score: none  Staffed with Criss Alvine, MD  Plan: On exam patient was no  acute stress stable vitals.  Patient is neuro vas intact but does have deformity to his left thumb that was confirmed by x-ray to have had a dislocation.  I spoke to the patient with the risk and benefit of reduction patient with full decision making capacity verbalized understanding acceptance of this and would like to proceed with the dislocation reduction.  Will order lidocaine.  Patient was febrile here but was endorsing any other symptoms.  Patient had a large workup done last night and after speaking with the attending we agree that patient most likely had a change in symptoms but will swab for respiratory illnesses.  Will also get Depakote level as patient is reportedly taking this as well.  Patient's thumb was initially reduced however upon mind.  Interpretation of the x-ray it still appears dislocated and so patient's thumb was reduced once more and we will get repeat imaging.  Patient was neuro vas intact before and after each reduction attempt.  Patient signed out to [place], [place].  Please review their note for the continuation of patient's care.  The plan at this point is follow-up on imaging and discharge if he is able to ambulate. Plan on hand follow up.  This chart was dictated using voice recognition software.  Despite best efforts to proofread,  errors can occur which can change the documentation meaning.         Final Clinical Impression(s) / ED Diagnoses Final diagnoses:  None    Rx / DC Orders ED Discharge Orders     None         Netta Corrigan, PA-C 07/09/23 1459    Netta Corrigan, PA-C 07/09/23 1602    Pricilla Loveless, MD 07/10/23 [phone]

## 2023-07-09 NOTE — Discharge Instructions (Addendum)
Your thumb has been successfully put back in place. Alternate between Ibuprofen and Tylenol every 4 hours as needed for pain.  Call hand surgery tomorrow to make an appointment for reevaluation of your symptoms.  You are negative for flu, COVID, or RSV.  You most likely have another viral illness that is causing your body aches.  Alternating between Tylenol and Ibuprofen will help with this as well.  Get help right away if: You lose feeling in your finger or thumb. You cannot move your finger or thumb. Your finger or thumb is pale or cold. You have very bad pain.

## 2023-07-09 NOTE — ED Triage Notes (Signed)
Pt c.o multiple falls last night after being discharged from here after a fall. Pt states his legs just give out on him. Pt had deformity to left thumb. Pt c.o bilateral leg pain.

## 2023-07-12 LAB — VITAMIN B1: Vitamin B1 (Thiamine): 99.7 nmol/L (ref 66.5–200.0)

## 2024-02-16 ENCOUNTER — Encounter (HOSPITAL_COMMUNITY): Payer: Self-pay

## 2024-02-16 ENCOUNTER — Emergency Department (HOSPITAL_COMMUNITY)
Admission: EM | Admit: 2024-02-16 | Discharge: 2024-02-16 | Disposition: A | Discharge status: Home / Self Care | Payer: MEDICAID | Attending: Emergency Medicine | Admitting: Emergency Medicine

## 2024-02-16 ENCOUNTER — Other Ambulatory Visit: Payer: Self-pay

## 2024-02-16 ENCOUNTER — Emergency Department (HOSPITAL_COMMUNITY): Payer: MEDICAID

## 2024-02-16 DIAGNOSIS — M25561 Pain in right knee: Secondary | ICD-10-CM | POA: Insufficient documentation

## 2024-02-16 MED ORDER — METHYLPREDNISOLONE 4 MG PO TBPK: ORAL_TABLET | ORAL | 0 refills | Status: AC

## 2024-02-16 MED ORDER — DICLOFENAC SODIUM 1 % EX GEL: 4.0000 g | Freq: Four times a day (QID) | CUTANEOUS | 0 refills | Status: AC
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
## 2024-02-16 NOTE — ED Notes (Signed)
 Renee called asking if PT is ready to go [phone]. When PT is discharged have him call [facility] [phone].
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
## 2024-02-16 NOTE — Discharge Instructions (Signed)
 Follow up with your ortho doc or PCP in the office.
